# Patient Record
Sex: Male | Born: 1975 | Race: Black or African American | Hispanic: No | Marital: Married | Smoking: Never smoker
Health system: Southern US, Community
[De-identification: ages and names within clinical notes are randomized; demographics above are authoritative.]

## PROBLEM LIST (undated history)

## (undated) DIAGNOSIS — E785 Hyperlipidemia, unspecified: Secondary | ICD-10-CM

## (undated) DIAGNOSIS — E119 Type 2 diabetes mellitus without complications: Secondary | ICD-10-CM

## (undated) HISTORY — DX: Hyperlipidemia, unspecified: E78.5

## (undated) HISTORY — DX: Type 2 diabetes mellitus without complications: E11.9

---

## 2009-12-15 ENCOUNTER — Ambulatory Visit: Payer: Self-pay | Admitting: Internal Medicine

## 2009-12-15 ENCOUNTER — Encounter (INDEPENDENT_AMBULATORY_CARE_PROVIDER_SITE_OTHER): Payer: Self-pay | Admitting: Family Medicine

## 2009-12-15 LAB — CONVERTED CEMR LAB
ALT: 22 units/L (ref 0–53)
AST: 16 units/L (ref 0–37)
BUN: 20 mg/dL (ref 6–23)
Calcium: 9.5 mg/dL (ref 8.4–10.5)
HCT: 47.5 % (ref 39.0–52.0)
HDL: 35 mg/dL — ABNORMAL LOW (ref >39)
Hemoglobin: 16 g/dL (ref 13.0–17.0)
Lymphs Abs: 2 10*3/uL (ref 0.7–4.0)
Monocytes Absolute: 0.4 10*3/uL (ref 0.1–1.0)
Monocytes Relative: 8 % (ref 3–12)
Platelets: 146 10*3/uL — ABNORMAL LOW (ref 150–400)
Total Bilirubin: 1.1 mg/dL (ref 0.3–1.2)
Total CHOL/HDL Ratio: 6.6
Total Protein: 6.9 g/dL (ref 6.0–8.3)
Triglycerides: 131 mg/dL (ref <150)
WBC: 4.3 10*3/uL (ref 4.0–10.5)

## 2010-01-02 ENCOUNTER — Emergency Department (HOSPITAL_COMMUNITY): Admission: EM | Admit: 2010-01-02 | Discharge: 2010-01-02 | Payer: Self-pay | Admitting: Emergency Medicine

## 2010-01-13 ENCOUNTER — Ambulatory Visit: Payer: Self-pay | Admitting: Internal Medicine

## 2010-01-13 ENCOUNTER — Encounter (INDEPENDENT_AMBULATORY_CARE_PROVIDER_SITE_OTHER): Payer: Self-pay | Admitting: Family Medicine

## 2010-01-18 ENCOUNTER — Ambulatory Visit: Payer: Self-pay | Admitting: Internal Medicine

## 2010-01-20 ENCOUNTER — Ambulatory Visit (HOSPITAL_COMMUNITY): Admission: RE | Admit: 2010-01-20 | Discharge: 2010-01-20 | Payer: Self-pay | Admitting: Family Medicine

## 2010-02-08 ENCOUNTER — Ambulatory Visit: Payer: Self-pay | Admitting: Internal Medicine

## 2010-02-08 ENCOUNTER — Encounter (INDEPENDENT_AMBULATORY_CARE_PROVIDER_SITE_OTHER): Payer: Self-pay | Admitting: Family Medicine

## 2010-02-08 LAB — CONVERTED CEMR LAB: ALT: 14 units/L (ref 0–53)

## 2010-02-17 ENCOUNTER — Ambulatory Visit: Payer: Self-pay | Admitting: Internal Medicine

## 2010-04-27 ENCOUNTER — Encounter (INDEPENDENT_AMBULATORY_CARE_PROVIDER_SITE_OTHER): Payer: Self-pay | Admitting: Family Medicine

## 2010-04-27 ENCOUNTER — Ambulatory Visit: Payer: Self-pay | Admitting: Internal Medicine

## 2010-04-27 LAB — CONVERTED CEMR LAB
ALT: 19 units/L (ref 0–53)
Cholesterol: 123 mg/dL (ref 0–200)
HDL: 35 mg/dL — ABNORMAL LOW (ref >39)
Hgb A1c MFr Bld: 7.4 % — ABNORMAL HIGH (ref <5.7)
LDL Cholesterol: 67 mg/dL (ref 0–99)
Total CHOL/HDL Ratio: 3.5

## 2010-05-17 ENCOUNTER — Ambulatory Visit: Payer: Self-pay | Admitting: Internal Medicine

## 2010-05-17 ENCOUNTER — Encounter (INDEPENDENT_AMBULATORY_CARE_PROVIDER_SITE_OTHER): Payer: Self-pay | Admitting: Family Medicine

## 2010-05-17 LAB — CONVERTED CEMR LAB: LDH: 141 units/L (ref 94–250)

## 2010-10-17 ENCOUNTER — Encounter: Payer: Self-pay | Admitting: Family Medicine

## 2013-01-14 ENCOUNTER — Ambulatory Visit (INDEPENDENT_AMBULATORY_CARE_PROVIDER_SITE_OTHER): Payer: BC Managed Care – PPO | Admitting: Emergency Medicine

## 2013-01-14 ENCOUNTER — Encounter: Payer: Self-pay | Admitting: *Deleted

## 2013-01-14 VITALS — BP 116/80 | HR 73 | Temp 98.7°F | Resp 12 | Ht 66.0 in | Wt 183.0 lb

## 2013-01-14 DIAGNOSIS — E119 Type 2 diabetes mellitus without complications: Secondary | ICD-10-CM

## 2013-01-14 MED ORDER — METFORMIN HCL ER 500 MG PO TB24: ORAL_TABLET | ORAL | Status: DC

## 2013-01-14 MED ORDER — LISINOPRIL 5 MG PO TABS: 5.0000 mg | ORAL_TABLET | Freq: Every day | ORAL | Status: DC

## 2013-01-14 NOTE — Patient Instructions (Addendum)

## 2013-01-14 NOTE — Progress Notes (Addendum)
[facility] [address] [place] [address] [phone]  Date:  01/14/2013   Name:  Oscar Fernandez   DOB:  08-28-1976   MRN:  [id number]  PCP:  No PCP Per Patient    Chief Complaint: Medication Refill   History of Present Illness:  Oscar Fernandez is a 37 y.o. very pleasant male patient who presents with the following:  [facility] a [facility]  Has been without his metformin for 3 months or more and requests refills.  Denies symptoms other than polyuria and polydipsia.  No fever chills, weight loss, nausea or vomiting no stool change, cough or coryza.  Denies other complaint or health concern today.    There is no problem list on file for this patient.   No past medical history on file.  No past surgical history on file.  History  Substance Use Topics  . Smoking status: Never Smoker   . Smokeless tobacco: Not on file  . Alcohol Use: Not on file    No family history on file.  No Known Allergies  Medication list has been reviewed and updated.  No current outpatient prescriptions on file prior to visit.   No current facility-administered medications on file prior to visit.    Review of Systems:  As per HPI, otherwise negative.    Physical Examination: Filed Vitals:   01/14/13 1717  BP: 116/80  Pulse: 73  Temp: 98.7 F (37.1 C)  Resp: 12   Filed Vitals:   01/14/13 1717  Height: 5\' 6"  (1.676 m)  Weight: 183 lb (83.008 kg)   Body mass index is 29.55 kg/(m^2). Ideal Body Weight: Weight in (lb) to have BMI = 25: 154.6  GEN: WDWN, NAD, Non-toxic, A & O x 3 HEENT: Atraumatic, Normocephalic. Neck supple. No masses, No LAD. Ears and Nose: No external deformity. CV: RRR, No M/G/R. No JVD. No thrill. No extra heart sounds. PULM: CTA B, no wheezes, crackles, rhonchi. No retractions. No resp. distress. No accessory muscle use. ABD: S, NT, ND, +BS. No rebound. No HSM. EXTR: No c/c/e NEURO Normal gait.  PSYCH: Normally  interactive. Conversant. Not depressed or anxious appearing.  Calm demeanor.    Assessment and Plan: NIDDM Non compliant Signed,  Phillips Odor, MD   Follow up in one month at 104  Results for orders placed in visit on 01/14/13  POCT GLYCOSYLATED HEMOGLOBIN (HGB A1C)      Result Value Range   Hemoglobin A1C 12.7

## 2013-02-15 ENCOUNTER — Ambulatory Visit (INDEPENDENT_AMBULATORY_CARE_PROVIDER_SITE_OTHER): Payer: BC Managed Care – PPO | Admitting: Family Medicine

## 2013-02-15 ENCOUNTER — Encounter: Payer: Self-pay | Admitting: Family Medicine

## 2013-02-15 VITALS — BP 120/85 | HR 74 | Temp 97.9°F | Resp 16 | Ht 65.5 in | Wt 180.0 lb

## 2013-02-15 DIAGNOSIS — Z0000 Encounter for general adult medical examination without abnormal findings: Secondary | ICD-10-CM

## 2013-02-15 DIAGNOSIS — IMO0001 Reserved for inherently not codable concepts without codable children: Secondary | ICD-10-CM

## 2013-02-15 DIAGNOSIS — J309 Allergic rhinitis, unspecified: Secondary | ICD-10-CM

## 2013-02-15 DIAGNOSIS — Z79899 Other long term (current) drug therapy: Secondary | ICD-10-CM

## 2013-02-15 LAB — POCT UA - MICROSCOPIC ONLY
Bacteria, U Microscopic: NEGATIVE
Crystals, Ur, HPF, POC: NEGATIVE

## 2013-02-15 LAB — CBC WITH DIFFERENTIAL/PLATELET
Basophils Absolute: 0 10*3/uL (ref 0.0–0.1)
Eosinophils Absolute: 0.1 10*3/uL (ref 0.0–0.7)
Eosinophils Relative: 4 % (ref 0–5)
Lymphs Abs: 1.5 10*3/uL (ref 0.7–4.0)
MCH: 28.6 pg (ref 26.0–34.0)
MCHC: 35.8 g/dL (ref 30.0–36.0)
Monocytes Relative: 10 % (ref 3–12)
Neutrophils Relative %: 40 % — ABNORMAL LOW (ref 43–77)
WBC: 3.3 10*3/uL — ABNORMAL LOW (ref 4.0–10.5)

## 2013-02-15 LAB — LIPID PANEL
Cholesterol: 193 mg/dL (ref 0–200)
LDL Cholesterol: 142 mg/dL — ABNORMAL HIGH (ref 0–99)
Total CHOL/HDL Ratio: 5.1 Ratio
VLDL: 13 mg/dL (ref 0–40)

## 2013-02-15 LAB — POCT URINALYSIS DIPSTICK
Ketones, UA: NEGATIVE
Leukocytes, UA: NEGATIVE
Protein, UA: NEGATIVE
Spec Grav, UA: 1.025
Urobilinogen, UA: 0.2

## 2013-02-15 LAB — COMPREHENSIVE METABOLIC PANEL
Albumin: 4.2 g/dL (ref 3.5–5.2)
CO2: 29 mEq/L (ref 19–32)
Chloride: 103 mEq/L (ref 96–112)
Glucose, Bld: 137 mg/dL — ABNORMAL HIGH (ref 70–99)
Sodium: 139 mEq/L (ref 135–145)
Total Bilirubin: 1.3 mg/dL — ABNORMAL HIGH (ref 0.3–1.2)
Total Protein: 6.9 g/dL (ref 6.0–8.3)

## 2013-02-15 LAB — GLUCOSE, POCT (MANUAL RESULT ENTRY): POC Glucose: 138 mg/dl — AB (ref 70–99)

## 2013-02-15 LAB — TSH: TSH: 0.85 u[IU]/mL (ref 0.350–4.500)

## 2013-02-15 MED ORDER — FLUTICASONE PROPIONATE 50 MCG/ACT NA SUSP: 2.0000 | Freq: Every day | NASAL | Status: DC

## 2013-02-15 MED ORDER — CETIRIZINE HCL 10 MG PO TABS: 10.0000 mg | ORAL_TABLET | Freq: Every day | ORAL | Status: DC

## 2013-02-15 MED ORDER — METFORMIN HCL 1000 MG PO TABS: 1000.0000 mg | ORAL_TABLET | Freq: Two times a day (BID) | ORAL | Status: DC

## 2013-02-15 NOTE — Progress Notes (Signed)
Subjective:    Patient ID: Oscar Fernandez, male    DOB: 19-Oct-1975, 37 y.o.   MRN: [id number] Chief Complaint  Patient presents with  . Annual Exam    HPI  [name] is a delightful 37 yo with a PMHx sig for DMII.  He was a prev pt at [facility] (seen by Dr. Carolyne Fiscal and Dr. Allena Katz) but had not had any medical care for >1 yr so had been off all medication for some time. Reports that prior he was on metformin 1000 bid in addition to some pill he used to take 15-30 min before eating (likely a sulfonylurea).  Had ran out of metformin for > 3mos so last hgba1c 1 mo prev was 12.7. Now he was weaned up from metformin 500 XR qhs to 500 XR qid w/o side effects.  Works in the evening so occ has some problems remember to take it - especially if he is off that day and so not working. Does have meter at home but no strips so not checking sugars. Does not know type of meter.  Used to play soccor but now not getting any exercise. In [place], used to drink a ton of high sugar drinks, no alcohol - but lots of sodas, Gatorades, energy drinks, etc. Thinks that is why he developed severe DM so young.  Now only drinking only diet sodas. Still doing a high carb diet though - knows he needs to change but difficult as his indigenous foods are high in  "fufu," corn, grain, potatoes.  When he was off of all DM med he was having constant HAs and polyuria, polydyspia, and a lot of nocturia - to the point where he was having enuresis. However, all of this has stopped since starting back on the metformin.  No prob w/ feet. No prob w/ vision.  Has not had optho appt recently.  Fasting now - last meal at 10 pm, did have a diet soda at 3 am as worked last night.  Past Medical History  Diagnosis Date  . Diabetes mellitus without complication    Current Outpatient Prescriptions on File Prior to Visit  Medication Sig Dispense Refill  . lisinopril (PRINIVIL,ZESTRIL) 5 MG tablet Take 1 tablet (5 mg total) by mouth daily.  90 tablet  3    No current facility-administered medications on file prior to visit.   No Known Allergies History reviewed. No pertinent past surgical history. History   Social History  . Marital Status: Married    Spouse Name: N/A    Number of Children: N/A  . Years of Education: college   Occupational History  . industrial    Social History Main Topics  . Smoking status: Never Smoker   . Smokeless tobacco: None  . Alcohol Use: No  . Drug Use: No  . Sexually Active: Yes   Other Topics Concern  . None   Social History Narrative  . None   Sleeps from 5-10p.m. 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 [place]. Moved here with his wife and 1 yr old daughter from [place] - close to [place] - 5 yrs ago.  Now he has 3 daughters -ranging from 2 to 74 yo.  History reviewed. No pertinent family history.  Father passed away during his  first yr of University of unknown etiology but was sick for yrs prior - no known cancer or heart disease.  Mother w/o medical problems No DM in family  Review of Systems  Constitutional: Negative.   HENT: Positive for congestion. Negative for sore throat and sinus pressure.        Ears itching  Eyes: Positive for itching.  Respiratory: Negative.   Cardiovascular: Negative.   Gastrointestinal: Negative.   Endocrine: Negative.   Genitourinary: Negative.   Musculoskeletal: Negative.   Skin: Negative.   Allergic/Immunologic: Negative.   Neurological: Negative.   Hematological: Negative.   Psychiatric/Behavioral: Negative.       BP 120/85  Pulse 74  Temp(Src) 97.9 F (36.6 C) (Oral)  Resp 16  Ht 5' 5.5" (1.664 m)  Wt 180 lb (81.647 kg)  BMI 29.49 kg/m2 Objective:   Physical Exam  Constitutional: He is oriented to person, place, and time. He appears well-developed and well-nourished. No  distress.  HENT:  Head: Normocephalic and atraumatic.  Right Ear: External ear and ear canal normal. Tympanic membrane is injected and retracted. A middle ear effusion is present.  Left Ear: External ear and ear canal normal. Tympanic membrane is injected and retracted. A middle ear effusion is present.  Nose: Mucosal edema present. No rhinorrhea. Right sinus exhibits no maxillary sinus tenderness and no frontal sinus tenderness. Left sinus exhibits no maxillary sinus tenderness and no frontal sinus tenderness.  Mouth/Throat: Uvula is midline and mucous membranes are normal. Posterior oropharyngeal erythema present. No oropharyngeal exudate or posterior oropharyngeal edema.  Eyes: Conjunctivae are normal. Right eye exhibits no discharge. Left eye exhibits no discharge. No scleral icterus.  Neck: Normal range of motion. Neck supple. No thyromegaly present.  Cardiovascular: Normal rate, regular rhythm, normal heart sounds and intact distal pulses.   Pulmonary/Chest: Effort normal and breath sounds normal. No respiratory distress.  Abdominal: Soft. Bowel sounds are normal. He exhibits no distension and no mass. There is no tenderness. There is no rebound and no guarding.  Musculoskeletal: He exhibits no edema.  Lymphadenopathy:       Head (right side): No submandibular and no tonsillar adenopathy present.       Head (left side): No submandibular and no tonsillar adenopathy present.    He has no cervical adenopathy.       Right cervical: No superficial cervical and no posterior cervical adenopathy present.      Left cervical: No superficial cervical and no posterior cervical adenopathy present.       Right: No supraclavicular adenopathy present.       Left: No supraclavicular adenopathy present.  Neurological: He is alert and oriented to person, place, and time. He has normal reflexes. No cranial nerve deficit. He exhibits normal muscle tone.  nml monofil  Skin: Skin is warm and dry. No rash  noted. He is not diaphoretic. No erythema.  Bilateral legs with copious scars - [place]  Psychiatric: He has a normal mood and affect. His behavior is normal.      Results for orders placed in visit on 01/14/13  POCT GLYCOSYLATED HEMOGLOBIN (HGB A1C)      Result Value Range   Hemoglobin A1C 12.7      Assessment & Plan:  Routine general medical examination at a health care facility - Plan: CBC with Differential, Comprehensive metabolic panel, TSH, POCT UA - Microscopic Only, POCT urinalysis dipstick  Type II or unspecified type diabetes mellitus without mention of  complication, uncontrolled - Plan: Lipid panel, POCT glucose (manual entry), Microalbumin, urine, Ambulatory referral to Ophthalmology - Refer to optho. monofil today nml.  Recheck a1c at f/u in 1 mo.  D/c xr metformin 500 qid and change to 1000 immed release bid. If a1c not at goal at f/u, start additional med. rx given for strips, meter, and lancets. Need to discuss starting asa 81 at f/u.  Allergic rhinitis - start zyrtec and flonase x 1 mo  Encounter for long-term (current) use of other medications - cmp

## 2013-02-15 NOTE — Patient Instructions (Signed)
Diabetes and Exercise Regular exercise is important and can help:   Control blood glucose (sugar).  Decrease blood pressure.    Control blood lipids (cholesterol, triglycerides).  Improve overall health. BENEFITS FROM EXERCISE  Improved fitness.  Improved flexibility.  Improved endurance.  Increased bone density.  Weight control.  Increased muscle strength.  Decreased body fat.  Improvement of the body's use of insulin, a hormone.  Increased insulin sensitivity.  Reduction of insulin needs.  Reduced stress and tension.  Helps you feel better. People with diabetes who add exercise to their lifestyle gain additional benefits, including:  Weight loss.  Reduced appetite.  Improvement of the body's use of blood glucose.  Decreased risk factors for heart disease:  Lowering of cholesterol and triglycerides.  Raising the level of good cholesterol (high-density lipoproteins, HDL).  Lowering blood sugar.  Decreased blood pressure. TYPE 1 DIABETES AND EXERCISE  Exercise will usually lower your blood glucose.  If blood glucose is greater than 240 mg/dl, check urine ketones. If ketones are present, do not exercise.  Location of the insulin injection sites may need to be adjusted with exercise. Avoid injecting insulin into areas of the body that will be exercised. For example, avoid injecting insulin into:  The arms when playing tennis.  The legs when jogging. For more information, discuss this with your caregiver.  Keep a record of:  Food intake.  Type and amount of exercise.  Expected peak times of insulin action.  Blood glucose levels. Do this before, during, and after exercise. Review your records with your caregiver. This will help you to develop guidelines for adjusting food intake and insulin amounts.  TYPE 2 DIABETES AND EXERCISE  Regular physical activity can help control blood glucose.  Exercise is important because it may:  Increase the  body's sensitivity to insulin.  Improve blood glucose control.  Exercise reduces the risk of heart disease. It decreases serum cholesterol and triglycerides. It also lowers blood pressure.  Those who take insulin or oral hypoglycemic agents should watch for signs of hypoglycemia. These signs include dizziness, shaking, sweating, chills, and confusion.  Body water is lost during exercise. It must be replaced. This will help to avoid loss of body fluids (dehydration) or heat stroke. Be sure to talk to your caregiver before starting an exercise program to make sure it is safe for you. Remember, any activity is better than none.  Document Released: 12/03/2003 Document Revised: 12/05/2011 Document Reviewed: 03/19/2009 [facility] Patient Information 2014 [place], [place]. Diabetes and Standards of Medical Care  Diabetes is complicated. You may find that your diabetes team includes a dietitian, nurse, diabetes educator, eye doctor, and more. To help everyone know what is going on and to help you get the care you deserve, the following schedule of care was developed to help keep you on track. Below are the tests, exams, vaccines, medicines, education, and plans you will need. A1c test  Performed at least 2 times a year if you are meeting treatment goals.  Performed 4 times a year if therapy has changed or if you are not meeting treatment goals. Blood pressure test  Performed at every routine medical visit. The goal is less than 120/80 mmHg. Dental exam  Follow up with the dentist regularly. Eye exam  Diagnosed with type 1 diabetes as a child: Get an exam upon reaching the age of 10 years or older and having had diabetes for 3 5 years. Yearly eye exams are recommended after that initial eye exam.  Diagnosed  with type 1 diabetes as an adult: Get an exam within 5 years of diagnosis and then yearly.  Diagnosed with type 2 diabetes: Get an exam as soon as possible after the diagnosis and then  yearly. Foot care exam  Visual foot exams are performed at every routine medical visit. The exams check for cuts, injuries, or other problems with the feet.  A comprehensive foot exam should be done yearly. This includes visual inspection as well as assessing foot pulses and testing for loss of sensation. Kidney function test (urine microalbumin)  Performed once a year.  Type 1 diabetes: The first test is performed 5 years after diagnosis.  Type 2 diabetes: The first test is performed at the time of diagnosis.  A serum creatinine and estimated glomerular filtration rate (eGFR) test is done once a year to tell the level of chronic kidney disease (CKD), if present. Lipid profile (Cholesterol, HDL, LDL, Triglycerides)  Performed every 5 years for most people.  The goal for LDL is less than 100 mg/dl. If at high risk, the goal is less than 70 mg/dl.  The goal for HDL is 40 mg/dl 50 mg/dl for men and 50 mg/dl 60 mg/dl for women. An HDL cholesterol of 60 mg/dL or higher gives some protection against heart disease.  The goal for triglycerides is less than 150 mg/dl. Influenza vaccine, pneumococcal vaccine, and hepatitis B vaccine  The influenza vaccine is recommended yearly.  The pneumococcal vaccine is generally given once in a lifetime. However, there are some instances when another vaccination is recommended. Check with your caregiver.  The hepatitis B vaccine is also recommended for adults with diabetes. Diabetes self-management education  Recommended at diagnosis and ongoing as needed. Treatment plan  Reviewed at every medical visit. Document Released: 07/10/2009 Document Revised: 08/29/2012 Document Reviewed: 03/15/2011 [facility] Patient Information 2014 [place], [place]. Diabetes Meal Planning Guide The diabetes meal planning guide is a tool to help you plan your meals and snacks. It is important for people with diabetes to manage their blood glucose (sugar) levels. Choosing the  right foods and the right amounts throughout your day will help control your blood glucose. Eating right can even help you improve your blood pressure and reach or maintain a healthy weight. CARBOHYDRATE COUNTING MADE EASY When you eat carbohydrates, they turn to sugar. This raises your blood glucose level. Counting carbohydrates can help you control this level so you feel better. When you plan your meals by counting carbohydrates, you can have more flexibility in what you eat and balance your medicine with your food intake. Carbohydrate counting simply means adding up the total amount of carbohydrate grams in your meals and snacks. Try to eat about the same amount at each meal. Foods with carbohydrates are listed below. Each portion below is 1 carbohydrate serving or 15 grams of carbohydrates. Ask your dietician how many grams of carbohydrates you should eat at each meal or snack. Grains and Starches  1 slice bread.   English muffin or hotdog/hamburger bun.   cup cold cereal (unsweetened).   cup cooked pasta or rice.   cup starchy vegetables (corn, potatoes, peas, beans, winter squash).  1 tortilla (6 inches).   bagel.  1 waffle or pancake (size of a CD).   cup cooked cereal.  4 to 6 small crackers. *Whole grain is recommended. Fruit  1 cup fresh unsweetened berries, melon, papaya, pineapple.  1 small fresh fruit.   banana or mango.   cup fruit juice (4 oz unsweetened).  cup canned fruit in natural juice or water.  2 tbs dried fruit.  12 to 15 grapes or cherries. Milk and Yogurt  1 cup fat-free or 1% milk.  1 cup soy milk.  6 oz light yogurt with sugar-free sweetener.  6 oz low-fat soy yogurt.  6 oz plain yogurt. Vegetables  1 cup raw or  cup cooked is counted as 0 carbohydrates or a "free" food.  If you eat 3 or more servings at 1 meal, count them as 1 carbohydrate serving. Other Carbohydrates   oz chips or pretzels.   cup ice cream or frozen  yogurt.   cup sherbet or sorbet.  2 inch square cake, no frosting.  1 tbs honey, sugar, jam, jelly, or syrup.  2 small cookies.  3 squares of graham crackers.  3 cups popcorn.  6 crackers.  1 cup broth-based soup.  Count 1 cup casserole or other mixed foods as 2 carbohydrate servings.  Foods with less than 20 calories in a serving may be counted as 0 carbohydrates or a "free" food. You may want to purchase a book or computer software that lists the carbohydrate gram counts of different foods. In addition, the nutrition facts panel on the labels of the foods you eat are a good source of this information. The label will tell you how big the serving size is and the total number of carbohydrate grams you will be eating per serving. Divide this number by 15 to obtain the number of carbohydrate servings in a portion. Remember, 1 carbohydrate serving equals 15 grams of carbohydrate. SERVING SIZES Measuring foods and serving sizes helps you make sure you are getting the right amount of food. The list below tells how big or small some common serving sizes are.  1 oz.........4 stacked dice.  3 oz........Mar[place] KitchenDeck of cards.  1 tsp.......[place] KitchenTip of little finger.  1 tbs......Marland KitchenMarland KitchenThumb.  2 tbs.......Marland KitchenGolf ball.   cup......Marland KitchenHalf of a fist.  1 cup.......Marland KitchenA fist. SAMPLE DIABETES MEAL PLAN Below is a sample meal plan that includes foods from the grain and starches, dairy, vegetable, fruit, and meat groups. A dietician can individualize a meal plan to fit your calorie needs and tell you the number of servings needed from each food group. However, controlling the total amount of carbohydrates in your meal or snack is more important than making sure you include all of the food groups at every meal. You may interchange carbohydrate containing foods (dairy, starches, and fruits). The meal plan below is an example of a 2000 calorie diet using carbohydrate counting. This meal plan has 17 carbohydrate  servings. Breakfast  1 cup oatmeal (2 carb servings).   cup light yogurt (1 carb serving).  1 cup blueberries (1 carb serving).   cup almonds. Snack  1 large apple (2 carb servings).  1 low-fat string cheese stick. Lunch  Chicken breast salad.  1 cup spinach.   cup chopped tomatoes.  2 oz chicken breast, sliced.  2 tbs low-fat [place] dressing.  12 whole-wheat crackers (2 carb servings).  12 to 15 grapes (1 carb serving).  1 cup low-fat milk (1 carb serving). Snack  1 cup carrots.   cup hummus (1 carb serving). Dinner  3 oz broiled salmon.  1 cup brown rice (3 carb servings). Snack  1  cups steamed broccoli (1 carb serving) drizzled with 1 tsp olive oil and lemon juice.  1 cup light pudding (2 carb servings). DIABETES MEAL PLANNING WORKSHEET Your dietician can use this worksheet to help  you decide how many servings of foods and what types of foods are right for you.  BREAKFAST Food Group and Servings / Carb Servings Grain/Starches __________________________________ Dairy __________________________________________ Vegetable ______________________________________ Fruit ___________________________________________ Meat __________________________________________ Fat ____________________________________________ LUNCH Food Group and Servings / Carb Servings Grain/Starches ___________________________________ Dairy ___________________________________________ Fruit ____________________________________________ Meat ___________________________________________ Fat _____________________________________________ Laural Golden Food Group and Servings / Carb Servings Grain/Starches ___________________________________ Dairy ___________________________________________ Fruit ____________________________________________ Meat ___________________________________________ Fat _____________________________________________ SNACKS Food Group and Servings / Carb  Servings Grain/Starches ___________________________________ Dairy ___________________________________________ Vegetable _______________________________________ Fruit ____________________________________________ Meat ___________________________________________ Fat _____________________________________________ DAILY TOTALS Starches _________________________ Vegetable ________________________ Fruit ____________________________ Dairy ____________________________ Meat ____________________________ Fat ______________________________ Document Released: 06/09/2005 Document Revised: 12/05/2011 Document Reviewed: 04/20/2009 [employer] Patient Information 2014 [employer]. Diabetes, Eating Away From Home Sometimes, you might eat in a restaurant or have meals that are prepared by someone else. You can enjoy eating out. However, the portions in restaurants may be much larger than needed. Listed below are some ideas to help you choose foods that will keep your blood glucose (sugar) in better control.  TIPS FOR EATING OUT  Know your meal plan and how many carbohydrate servings you should have at each meal. You may wish to carry a copy of your meal plan in your purse or wallet. Learn the foods included in each food group.  Make a list of restaurants near you that offer healthy choices. Take a copy of the carry-out menus to see what they offer. Then, you can plan what you will order ahead of time.  Become familiar with serving sizes by practicing them at home using measuring cups and spoons. Once you learn to recognize portion sizes, you will be able to correctly estimate the amount of total carbohydrate you are allowed to eat at the restaurant. Ask for a takeout box if the portion is more than you should have. When your food comes, leave the amount you should have on the plate, and put the rest in the takeout box before you start eating.  Plan ahead if your mealtime will be different from usual. Check  with your caregiver to find out how to time meals and medicine if you are taking insulin.  Avoid high-fat foods, such as fried foods, cream sauces, high-fat salad dressings, or any added butter or margarine.  Do not be afraid to ask questions. Ask your server about the portion size, cooking methods, ingredients and if items can be substituted. Restaurants do not list all available items on the menu. You can ask for your main entree to be prepared using skim milk, oil instead of butter or margarine, and without gravy or sauces. Ask your waiter or waitress to serve salad dressings, gravy, sauces, margarine, and sour cream on the side. You can then add the amount your meal plan suggests.  Add more vegetables whenever possible.  Avoid items that are labeled "jumbo," "giant," "deluxe," or "supersized."  You may want to split an entre with someone and order an extra side salad.  Watch for hidden calories in foods like croutons, bacon, or cheese.  Ask your server to take away the bread basket or chips from your table.  Order a dinner salad as an appetizer. You can eat most foods served in a restaurant. Some foods are better choices than others. Breads and Starches  Recommended: All kinds of bread (wheat, rye, white, oatmeal, [place], [place], raisin), hard or soft dinner rolls, frankfurter or hamburger buns, small bagels, small corn or whole-wheat flour tortillas.  Avoid: Frosted or glazed breads,  butter rolls, egg or cheese breads, croissants, sweet rolls, pastries, coffee cake, glazed or frosted doughnuts, muffins. Crackers  Recommended: Animal crackers, graham, rye, saltine, oyster, and matzoth crackers. Bread sticks, melba toast, rusks, pretzels, popcorn (without fat), zwieback toast.  Avoid: High-fat snack crackers or chips. Buttered popcorn. Cereals  Recommended: Hot and cold cereals. Whole grains such as oatmeal or shredded wheat are good choices.  Avoid: Sugar-coated or granola type  cereals. Potatoes/Pasta/Rice/Beans  Recommended: Order baked, boiled, or mashed potatoes, rice or noodles without added fat, whole beans. Order gravies, butter, margarine, or sauces on the side so you can control the amount you add.  Avoid: Hash browns or fried potatoes. Potatoes, pasta, or rice prepared with cream or cheese sauce. Potato or pasta salads prepared with large amounts of dressing. Fried beans or fried rice. Vegetables  Recommended: Order steamed, baked, boiled, or stewed vegetables without sauces or extra fat. Ask that sauce be served on the side. If vegetables are not listed on the menu, ask what is available.  Avoid: Vegetables prepared with cream, butter, or cheese sauce. Fried vegetables. Salad Bars  Recommended: Many of the vegetables at a salad bar are considered "free." Use lemon juice, vinegar, or low-calorie salad dressing (fewer than 20 calories per serving) as "free" dressings for your salad. Look for salad bar ingredients that have no added fat or sugar such as tomatoes, lettuce, cucumbers, broccoli, carrots, onions, and mushrooms.  Avoid: Prepared salads with large amounts of dressing, such as coleslaw, caesar salad, macaroni salad, bean salad, or carrot salad. Fruit  Recommended: Eat fresh fruit or fresh fruit salad without added dressing. A salad bar often offers fresh fruit choices, but canned fruit at a restaurant is usually packed in sugar or syrup.  Avoid: Sweetened canned or frozen fruits, plain or sweetened fruit juice. Fruit salads with dressing, sour cream, or sugar added to them. Meat and Meat Substitutes  Recommended: Order broiled, baked, roasted, or grilled meat, poultry, or fish. Trim off all visible fat. Do not eat the skin of poultry. The size stated on the menu is the raw weight. Meat shrinks by  in cooking (for example, 4 oz raw equals 3 oz cooked meat).  Avoid: Deep-fat fried meat, poultry, or fish. Breaded meats. Eggs  Recommended: Order  soft, hard-cooked, poached, or scrambled eggs. Omelets may be okay, depending on what ingredients are added. Egg substitutes are also a good choice.  Avoid: Fried eggs, eggs prepared with cream or cheese sauce. Milk  Recommended: Order low-fat or fat-free milk according to your meal plan. Plain, nonfat yogurt or flavored yogurt with no sugar added may be used as a substitute for milk. Soy milk may also be used.  Avoid: Milk shakes or sweetened milk beverages. Soups and Combination Foods  Recommended: Clear broth or consomm are "free" foods and may be used as an appetizer. Broth-based soups with fat removed count as a starch serving and are preferred over cream soups. Soups made with beans or split peas may be eaten but count as a starch.  Avoid: Fatty soups, soup made with cream, cheese soup. Combination foods prepared with excessive amounts of fat or with cream or cheese sauces. Desserts and Sweets  Recommended: Ask for fresh fruit. Sponge or angel food cake without icing, ice milk, no sugar added ice cream, sherbet, or frozen yogurt may fit into your meal plan occasionally.  Avoid: Pastries, puddings, pies, cakes with icing, custard, gelatin desserts. Fats and Oils  Recommended: Choose healthy fats such  as olive oil, canola oil, or tub margarine, reduced fat or fat-free sour cream, cream cheese, avocado, or nuts.  Avoid: Any fats in excess of your allowed portion. Deep-fried foods or any food with a large amount of fat. Note: Ask for all fats to be served on the side, and limit your portion sizes according to your meal plan. Document Released: 09/12/2005 Document Revised: 12/05/2011 Document Reviewed: 04/02/2009 [facility] Patient Information 2014 [place], [place].

## 2013-02-20 ENCOUNTER — Other Ambulatory Visit: Payer: Self-pay | Admitting: Family Medicine

## 2013-02-20 ENCOUNTER — Encounter: Payer: Self-pay | Admitting: *Deleted

## 2013-02-20 DIAGNOSIS — E785 Hyperlipidemia, unspecified: Secondary | ICD-10-CM

## 2013-02-20 MED ORDER — PRAVASTATIN SODIUM 40 MG PO TABS: 40.0000 mg | ORAL_TABLET | Freq: Every day | ORAL | Status: DC

## 2013-02-27 ENCOUNTER — Ambulatory Visit: Payer: BC Managed Care – PPO

## 2013-02-27 ENCOUNTER — Ambulatory Visit (INDEPENDENT_AMBULATORY_CARE_PROVIDER_SITE_OTHER): Payer: BC Managed Care – PPO | Admitting: Family Medicine

## 2013-02-27 VITALS — BP 114/70 | HR 67 | Temp 97.7°F | Resp 18 | Ht 65.5 in | Wt 179.0 lb

## 2013-02-27 DIAGNOSIS — K5900 Constipation, unspecified: Secondary | ICD-10-CM

## 2013-02-27 DIAGNOSIS — IMO0001 Reserved for inherently not codable concepts without codable children: Secondary | ICD-10-CM

## 2013-02-27 DIAGNOSIS — R109 Unspecified abdominal pain: Secondary | ICD-10-CM

## 2013-02-27 LAB — POCT CBC
Lymph, poc: 2.2 (ref 0.6–3.4)
MCH, POC: 28.7 pg (ref 27–31.2)
MCHC: 32.2 g/dL (ref 31.8–35.4)
MPV: 9.4 fL (ref 0–99.8)
Platelet Count, POC: 233 10*3/uL (ref 142–424)
RBC: 5.54 M/uL (ref 4.69–6.13)

## 2013-02-27 LAB — COMPREHENSIVE METABOLIC PANEL
Albumin: 4.5 g/dL (ref 3.5–5.2)
Alkaline Phosphatase: 50 U/L (ref 39–117)
BUN: 10 mg/dL (ref 6–23)
CO2: 29 mEq/L (ref 19–32)
Calcium: 9.5 mg/dL (ref 8.4–10.5)
Chloride: 101 mEq/L (ref 96–112)
Glucose, Bld: 167 mg/dL — ABNORMAL HIGH (ref 70–99)
Total Bilirubin: 1 mg/dL (ref 0.3–1.2)
Total Protein: 6.8 g/dL (ref 6.0–8.3)

## 2013-02-27 MED ORDER — POLYETHYLENE GLYCOL 3350 17 GM/SCOOP PO POWD: 17.0000 g | Freq: Two times a day (BID) | ORAL | Status: DC

## 2013-02-27 MED ORDER — DOCUSATE SODIUM 100 MG PO CAPS: 100.0000 mg | ORAL_CAPSULE | Freq: Two times a day (BID) | ORAL | Status: DC

## 2013-02-27 NOTE — Progress Notes (Signed)
Subjective:    Patient ID: Oscar Fernandez, male    DOB: [birth year], 37 y.o.   MRN: [id number] Chief Complaint  Patient presents with  . Abdominal Pain    Since last Friday- no vomiting  . Diarrhea    HPI   Stomach hurting x 5d - since last Friday.  Is is intermittent feels like it is cramping and improves with laying on stomach.  No n/v.  Normal appetite.  Sometimes food makes it worse but not certain - maybe greasy foods make it worse.  Diarrhea just started this morning - 2 episodes of small volume.  Before that, was feeling very constipated with little small hard stools. Feeling weak.  No prob tol metformin.  Checking sugars - 198.    Past Medical History  Diagnosis Date  . Diabetes mellitus without complication   . Hyperlipidemia    Current Outpatient Prescriptions on File Prior to Visit  Medication Sig Dispense Refill  . cetirizine (ZYRTEC) 10 MG tablet Take 1 tablet (10 mg total) by mouth at bedtime.  30 tablet  11  . fluticasone (FLONASE) 50 MCG/ACT nasal spray Place 2 sprays into the nose at bedtime.  16 g  6  . lisinopril (PRINIVIL,ZESTRIL) 5 MG tablet Take 1 tablet (5 mg total) by mouth daily.  90 tablet  3  . metFORMIN (GLUCOPHAGE) 1000 MG tablet Take 1 tablet (1,000 mg total) by mouth 2 (two) times daily with a meal.  180 tablet  3  . pravastatin (PRAVACHOL) 40 MG tablet Take 1 tablet (40 mg total) by mouth daily.  90 tablet  1   No current facility-administered medications on file prior to visit.   No Known Allergies   Review of Systems  Constitutional: Negative for fever, chills, diaphoresis, activity change and appetite change.  Respiratory: Negative for shortness of breath.   Cardiovascular: Negative for chest pain.  Gastrointestinal: Positive for abdominal pain, diarrhea, constipation and abdominal distention. Negative for nausea, vomiting, blood in stool, anal bleeding and rectal pain.  Genitourinary: Negative for dysuria, frequency and decreased urine volume.   Neurological: Positive for weakness.  Hematological: Negative for adenopathy.      BP 114/70  Pulse 67  Temp(Src) 97.7 F (36.5 C) (Oral)  Resp 18  Ht 5' 5.5" (1.664 m)  Wt 179 lb (81.194 kg)  BMI 29.32 kg/m2  SpO2 100% Objective:   Physical Exam  Constitutional: He appears well-developed and well-nourished. No distress.  HENT:  Head: Normocephalic and atraumatic.  Neck: Normal range of motion. Neck supple. No thyromegaly present.  Cardiovascular: Normal rate, regular rhythm and normal heart sounds.   Pulmonary/Chest: Effort normal and breath sounds normal.  Abdominal: Soft. Normal appearance and bowel sounds are normal. He exhibits no distension and no mass. There is no hepatosplenomegaly. There is tenderness in the left lower quadrant. There is no rigidity, no rebound, no guarding, no CVA tenderness, no tenderness at McBurney's point and negative Murphy's sign. No hernia.  Genitourinary: Rectum normal and prostate normal. Rectal exam shows no tenderness and anal tone normal. Guaiac negative stool.  Lymphadenopathy:    He has no cervical adenopathy.  Skin: He is not diaphoretic.          Results for orders placed in visit on 02/27/13  POCT CBC      Result Value Range   WBC 4.7  4.6 - 10.2 K/uL   Lymph, poc 2.2  0.6 - 3.4   POC LYMPH PERCENT 47.3  10 - 50 %L  MID (cbc) 0.4  0 - 0.9   POC MID % 8.6  0 - 12 %M   POC Granulocyte 2.1  2 - 6.9   Granulocyte percent 44.1  37 - 80 %G   RBC 5.54  4.69 - 6.13 M/uL   Hemoglobin 15.9  14.1 - 18.1 g/dL   HCT, POC 91.4  78.2 - 53.7 %   MCV 89.2  80 - 97 fL   MCH, POC 28.7  27 - 31.2 pg   MCHC 32.2  31.8 - 35.4 g/dL   RDW, POC 95.6     Platelet Count, POC 233  142 - 424 K/uL   MPV 9.4  0 - 99.8 fL  GLUCOSE, POCT (MANUAL RESULT ENTRY)      Result Value Range   POC Glucose 175 (*) 70 - 99 mg/dl    [facility] reading (PRIMARY) by  Dr. Clelia Croft. Non-specific bowel gas pattern, moderate amount of stool  Assessment & Plan:  Type II or  unspecified type diabetes mellitus without mention of complication, uncontrolled - Plan: POCT CBC, POCT glucose (manual entry), Comprehensive metabolic panel - just started on high dose metformin 6 wks ago so recheck in another 6 wks for a1c but will likely need ot start on an additional med as cbgs still elev.  Abdominal pain, unspecified site - Plan: POCT CBC, POCT glucose (manual entry), Comprehensive metabolic panel, DG Abd 2 Views - suspect due to constipation - start w/ miralax and colace. Try fleets enema. RTC if continues.  Unspecified constipation  Meds ordered this encounter  Medications  . polyethylene glycol powder (GLYCOLAX/MIRALAX) powder    Sig: Take 17 g by mouth 2 (two) times daily.    Dispense:  527 g    Refill:  1  . docusate sodium (COLACE) 100 MG capsule    Sig: Take 1 capsule (100 mg total) by mouth 2 (two) times daily.    Dispense:  10 capsule    Refill:  0

## 2013-02-27 NOTE — Patient Instructions (Addendum)

## 2014-03-14 ENCOUNTER — Ambulatory Visit: Payer: BC Managed Care – PPO | Admitting: Family Medicine

## 2014-04-18 ENCOUNTER — Encounter: Payer: Self-pay | Admitting: Family Medicine

## 2014-04-18 ENCOUNTER — Ambulatory Visit (INDEPENDENT_AMBULATORY_CARE_PROVIDER_SITE_OTHER): Payer: 59

## 2014-04-18 ENCOUNTER — Ambulatory Visit (INDEPENDENT_AMBULATORY_CARE_PROVIDER_SITE_OTHER): Payer: 59 | Admitting: Family Medicine

## 2014-04-18 VITALS — BP 106/80 | HR 58 | Temp 98.1°F | Resp 16 | Ht 66.0 in | Wt 181.6 lb

## 2014-04-18 DIAGNOSIS — E785 Hyperlipidemia, unspecified: Secondary | ICD-10-CM

## 2014-04-18 DIAGNOSIS — IMO0001 Reserved for inherently not codable concepts without codable children: Secondary | ICD-10-CM

## 2014-04-18 DIAGNOSIS — E1165 Type 2 diabetes mellitus with hyperglycemia: Secondary | ICD-10-CM

## 2014-04-18 DIAGNOSIS — Z23 Encounter for immunization: Secondary | ICD-10-CM

## 2014-04-18 DIAGNOSIS — M79644 Pain in right finger(s): Secondary | ICD-10-CM

## 2014-04-18 DIAGNOSIS — M79609 Pain in unspecified limb: Secondary | ICD-10-CM

## 2014-04-18 DIAGNOSIS — M6530 Trigger finger, unspecified finger: Secondary | ICD-10-CM

## 2014-04-18 DIAGNOSIS — Z79899 Other long term (current) drug therapy: Secondary | ICD-10-CM

## 2014-04-18 LAB — CBC
HEMATOCRIT: 42.9 % (ref 39.0–52.0)
HEMOGLOBIN: 14.8 g/dL (ref 13.0–17.0)
MCH: 28.6 pg (ref 26.0–34.0)
MCHC: 34.5 g/dL (ref 30.0–36.0)
MCV: 82.8 fL (ref 78.0–100.0)
Platelets: 189 10*3/uL (ref 150–400)
RBC: 5.18 MIL/uL (ref 4.22–5.81)
RDW: 14.1 % (ref 11.5–15.5)
WBC: 3.9 10*3/uL — AB (ref 4.0–10.5)

## 2014-04-18 LAB — POCT GLYCOSYLATED HEMOGLOBIN (HGB A1C): HEMOGLOBIN A1C: 7.8

## 2014-04-18 MED ORDER — GLIPIZIDE ER 2.5 MG PO TB24: 2.5000 mg | ORAL_TABLET | Freq: Every day | ORAL | Status: DC

## 2014-04-18 MED ORDER — ASPIRIN EC 81 MG PO TBEC: 81.0000 mg | DELAYED_RELEASE_TABLET | Freq: Every day | ORAL | Status: AC

## 2014-04-18 MED ORDER — MELOXICAM 15 MG PO TABS: 15.0000 mg | ORAL_TABLET | Freq: Every day | ORAL | Status: DC

## 2014-04-18 MED ORDER — METFORMIN HCL 1000 MG PO TABS: 1000.0000 mg | ORAL_TABLET | Freq: Two times a day (BID) | ORAL | Status: DC

## 2014-04-18 NOTE — Progress Notes (Addendum)
Subjective:    Patient ID: Oscar Fernandez, male    DOB: 08/24/1976, 38 y.o.   MRN: [id number] This chart was scribed for Sherren Mocha, MD by Julian Hy, ED Scribe. The patient was seen in Room 24. The patient's care was started at 2:59 PM.   Chief Complaint  Patient presents with  . Medication Refill    METFORMIN, and RIGHT THUMB PAIN X 3-4 MONTHS   Past Medical History  Diagnosis Date  . Diabetes mellitus without complication   . Hyperlipidemia    Current Outpatient Prescriptions on File Prior to Visit  Medication Sig Dispense Refill  . metFORMIN (GLUCOPHAGE) 1000 MG tablet Take 1 tablet (1,000 mg total) by mouth 2 (two) times daily with a meal.  180 tablet  3  . cetirizine (ZYRTEC) 10 MG tablet Take 1 tablet (10 mg total) by mouth at bedtime.  30 tablet  11  . docusate sodium (COLACE) 100 MG capsule Take 1 capsule (100 mg total) by mouth 2 (two) times daily.  10 capsule  0  . fluticasone (FLONASE) 50 MCG/ACT nasal spray Place 2 sprays into the nose at bedtime.  16 g  6  . lisinopril (PRINIVIL,ZESTRIL) 5 MG tablet Take 1 tablet (5 mg total) by mouth daily.  90 tablet  3  . polyethylene glycol powder (GLYCOLAX/MIRALAX) powder Take 17 g by mouth 2 (two) times daily.  527 g  1  . pravastatin (PRAVACHOL) 40 MG tablet Take 1 tablet (40 mg total) by mouth daily.  90 tablet  1   No current facility-administered medications on file prior to visit.   No Known Allergies   HPI HPI Comments: Oscar Fernandez is a 38 y.o. male who presents to the [facility] for medication refill for Metformin. Pt reports he ran out of his Metformin about 10 days ago. Pt states he checks his blood sugar every other day in the mornings and he ranges from 127-170. Pt denies feeling like his sugar is ever too low. Pt denies visiting the opthalmologist. Pt states he intermittently takes a baby aspirin. Pt denies any chills, fever, constipation, diarrhea, nausea and vomiting.  Pt states he has  right thumb pain. Pt states he cannot use his right thumb. Pt states it feels like his thumb gets stuck and he is unable to lift it. Pt denies radiating pain.  Seen last one year previously, had been off of all of his medication at that time. His A1c increased to 12.7. Placed on Metformin 1000 BID. He was referred to opthalmology and given a prescription for a meter.    Review of Systems  Constitutional: Negative for fever and chills.  Gastrointestinal: Negative for nausea, vomiting, diarrhea and constipation.  Musculoskeletal: Positive for arthralgias (right thumb).  Neurological: Negative for weakness and numbness.      Triage Vitals: BP 106/80  Pulse 58  Temp(Src) 98.1 F (36.7 C) (Oral)  Resp 16  Ht 5\' 6"  (1.676 m)  Wt 181 lb 9.6 oz (82.373 kg)  BMI 29.32 kg/m2  SpO2 99% Objective:   Physical Exam  Nursing note and vitals reviewed. Constitutional: He is oriented to person, place, and time. He appears well-developed and well-nourished. No distress.  HENT:  Head: Normocephalic and atraumatic.  Eyes: Conjunctivae and EOM are normal.  Neck: Neck supple. No tracheal deviation present.  Cardiovascular: Normal rate, regular rhythm and normal heart sounds.   Pulses:      Dorsalis pedis pulses are 2+ on the right  side, and 2+ on the left side.       Posterior tibial pulses are 2+ on the right side, and 2+ on the left side.  Negative phalens and reverse phalens  Pulmonary/Chest: Effort normal and breath sounds normal. No respiratory distress.  Musculoskeletal:  He is having pain in the 1st MCP. No pain over the snuff box or extensor tendon. Significant pain over the radial aspect of the 1st MCP joint and palmar aspect of the 1st carpal. Significant pain with thumb opposition and flexion.   Neurological: He is alert and oriented to person, place, and time.  Normal monofilament exam. Negative Phalen's and reverse Phalen's. Negative Tinel's.   Skin: Skin is warm and dry.  Psychiatric:  He has a normal mood and affect. His behavior is normal.      Results for orders placed in visit on 04/18/14  POCT GLYCOSYLATED HEMOGLOBIN (HGB A1C)      Result Value Ref Range   Hemoglobin A1C 7.8     Right hand x-ray. Primary reading by Dr. Clelia CroftShaw. 04/18/2014. IMPRESSION: Normal.  CLINICAL DATA: Right thumb pain  EXAM: RIGHT HAND - COMPLETE 3+ VIEW  COMPARISON: None.  FINDINGS: There is no evidence of fracture or dislocation. There is no evidence of arthropathy or other focal bone abnormality. Soft tissues are unremarkable.  IMPRESSION: Negative.   Assessment & Plan:  3:05 PM- Patient informed of current plan for treatment and evaluation and agrees with plan at this time. Thumb pain, right - Plan: Sedimentation rate, DG Hand Complete Right  Type II or unspecified type diabetes mellitus without mention of complication, uncontrolled - Plan: POCT glycosylated hemoglobin (Hb A1C), COMPLETE METABOLIC PANEL WITH GFR, CBC, Microalbumin/Creatinine Ratio, Urine, Ambulatory referral to Ophthalmology, CANCELED: POCT SEDIMENTATION RATE - has been off of metformin x 2 wks so hgba1c of 7.8 is actually pretty good - however, reported cbgs all to high and pt was prev on metformin and sulfonylurea so start glucotrol xl 2.5 in addition, recheck in 3 mos - then if doing well, ok to go to 1 yr f/u as pt really wanting to do. Start asa 81 and need DM eye exam. nml monofil today. Was prev on lisinopril but bp actually quite low today - restart if microalb is elevated. Was normal so ok to hold off on acei/arb for now.  Encounter for long-term (current) use of other medications - Plan: COMPLETE METABOLIC PANEL WITH GFR, CBC, TSH, Microalbumin/Creatinine Ratio, Urine, Sedimentation rate  Other and unspecified hyperlipidemia - Plan: COMPLETE METABOLIC PANEL WITH GFR, CBC, TSH, Lipid panel - was prev on pravastatin so check ldl - goal <100, non-hdl <130 - close to goal so work on tlc and recheck at  f/u  Trigger finger, acquired - thumb spica splint x 3 mos, ok to remove at rest but wear during work and sleep, if still having Rt thumb stiffness and pain, could cons referral to hand surg at f/u. Try meloxicam qam  Need for prophylactic vaccination with combined diphtheria-tetanus-pertussis (DTP) vaccine - Plan: Tdap vaccine greater than or equal to 7yo IM  Meds ordered this encounter  Medications  . DISCONTD: metFORMIN (GLUCOPHAGE) 1000 MG tablet    Sig: Take 1 tablet (1,000 mg total) by mouth 2 (two) times daily with a meal.    Dispense:  180 tablet    Refill:  3  . metFORMIN (GLUCOPHAGE) 1000 MG tablet    Sig: Take 1 tablet (1,000 mg total) by mouth 2 (two) times daily with a meal.  Dispense:  180 tablet    Refill:  3  . aspirin EC 81 MG tablet    Sig: Take 1 tablet (81 mg total) by mouth daily.  Marland Kitchen glipiZIDE (GLUCOTROL XL) 2.5 MG 24 hr tablet    Sig: Take 1 tablet (2.5 mg total) by mouth daily with breakfast.    Dispense:  90 tablet    Refill:  0  . meloxicam (MOBIC) 15 MG tablet    Sig: Take 1 tablet (15 mg total) by mouth daily.    Dispense:  30 tablet    Refill:  1    I personally performed the services described in this documentation, which was scribed in my presence. The recorded information has been reviewed and considered, and addended by me as needed.  Norberto Sorenson, MD MPH

## 2014-04-18 NOTE — Patient Instructions (Addendum)
Do not use the meloxicam with any other otc pain medication other than tylenol/acetaminophen - so no aleve, ibuprofen, motrin, advil, etc.   Trigger Finger Trigger finger (digital tendinitis and stenosing tenosynovitis) is a common disorder that causes an often painful catching of the fingers or thumb. It occurs as a clicking, snapping, or locking of a finger in the palm of the hand. This is caused by a problem with the tendons that flex or bend the fingers sliding smoothly through their sheaths. The condition may occur in any finger or a couple fingers at the same time.  The finger may lock with the finger curled or suddenly straighten out with a snap. This is more common in patients with rheumatoid arthritis and diabetes. Left untreated, the condition may get worse to the point where the finger becomes locked in flexion, like making a fist, or less commonly locked with the finger straightened out. CAUSES   Inflammation and scarring that lead to swelling around the tendon sheath.  Repeated or forceful movements.  Rheumatoid arthritis, an autoimmune disease that affects joints.  Gout.  Diabetes mellitus. SIGNS AND SYMPTOMS  Soreness and swelling of your finger.  A painful clicking or snapping as you bend and straighten your finger. DIAGNOSIS  Your health care provider will do a physical exam of your finger to diagnose trigger finger. TREATMENT   Splinting for 6-8 weeks may be helpful.  Nonsteroidal anti-inflammatory medicines (NSAIDs) can help to relieve the pain and inflammation.  Cortisone injections, along with splinting, may speed up recovery. Several injections may be required. Cortisone may give relief after one injection.  Surgery is another treatment that may be used if conservative treatments do not work. Surgery can be minor, without incisions (a cut does not have to be made), and can be done with a needle through the skin.  Other surgical choices involve an open procedure  in which the surgeon opens the hand through a small incision and cuts the pulley so the tendon can again slide smoothly. Your hand will still work fine. HOME CARE INSTRUCTIONS  Apply ice to the injured area, twice per day:  Put ice in a plastic bag.  Place a towel between your skin and the bag.  Leave the ice on for 20 minutes, 3-4 times a day.  Rest your hand often. MAKE SURE YOU:   Understand these instructions.  Will watch your condition.  Will get help right away if you are not doing well or get worse. Document Released: 07/02/2004 Document Revised: 05/15/2013 Document Reviewed: 02/12/2013 [facility] Patient Information 2015 [place], [place]. This information is not intended to replace advice given to you by your health care provider. Make sure you discuss any questions you have with your health care provider.

## 2014-04-19 ENCOUNTER — Encounter: Payer: Self-pay | Admitting: Family Medicine

## 2014-04-19 DIAGNOSIS — E1169 Type 2 diabetes mellitus with other specified complication: Secondary | ICD-10-CM | POA: Insufficient documentation

## 2014-04-19 DIAGNOSIS — E785 Hyperlipidemia, unspecified: Secondary | ICD-10-CM | POA: Insufficient documentation

## 2014-04-19 DIAGNOSIS — E119 Type 2 diabetes mellitus without complications: Secondary | ICD-10-CM | POA: Insufficient documentation

## 2014-04-19 LAB — MICROALBUMIN / CREATININE URINE RATIO
Creatinine, Urine: 169.5 mg/dL
Microalb Creat Ratio: 2.9 mg/g (ref 0.0–30.0)
Microalb, Ur: 0.5 mg/dL (ref 0.00–1.89)

## 2014-04-19 LAB — COMPLETE METABOLIC PANEL WITH GFR
ALT: 37 U/L (ref 0–53)
AST: 25 U/L (ref 0–37)
Albumin: 4.2 g/dL (ref 3.5–5.2)
Alkaline Phosphatase: 56 U/L (ref 39–117)
BUN: 11 mg/dL (ref 6–23)
CALCIUM: 9.4 mg/dL (ref 8.4–10.5)
CHLORIDE: 104 meq/L (ref 96–112)
CO2: 23 mEq/L (ref 19–32)
CREATININE: 0.86 mg/dL (ref 0.50–1.35)
Glucose, Bld: 132 mg/dL — ABNORMAL HIGH (ref 70–99)
POTASSIUM: 3.9 meq/L (ref 3.5–5.3)
SODIUM: 141 meq/L (ref 135–145)
TOTAL PROTEIN: 6.6 g/dL (ref 6.0–8.3)
Total Bilirubin: 1 mg/dL (ref 0.2–1.2)

## 2014-04-19 LAB — TSH: TSH: 0.641 u[IU]/mL (ref 0.350–4.500)

## 2014-04-19 LAB — LIPID PANEL
Cholesterol: 161 mg/dL (ref 0–200)
HDL: 37 mg/dL — ABNORMAL LOW (ref >39)
LDL CALC: 109 mg/dL — AB (ref 0–99)
TRIGLYCERIDES: 76 mg/dL (ref <150)
Total CHOL/HDL Ratio: 4.4 Ratio
VLDL: 15 mg/dL (ref 0–40)

## 2014-04-19 LAB — SEDIMENTATION RATE: SED RATE: 1 mm/h (ref 0–16)

## 2014-11-03 ENCOUNTER — Encounter (HOSPITAL_COMMUNITY): Payer: Self-pay | Admitting: Emergency Medicine

## 2014-11-03 ENCOUNTER — Emergency Department (HOSPITAL_COMMUNITY)
Admission: EM | Admit: 2014-11-03 | Discharge: 2014-11-04 | Disposition: A | Discharge status: Home / Self Care | Payer: 59 | Attending: Emergency Medicine | Admitting: Emergency Medicine

## 2014-11-03 DIAGNOSIS — Z7982 Long term (current) use of aspirin: Secondary | ICD-10-CM | POA: Diagnosis not present

## 2014-11-03 DIAGNOSIS — E119 Type 2 diabetes mellitus without complications: Secondary | ICD-10-CM | POA: Diagnosis not present

## 2014-11-03 DIAGNOSIS — Z8639 Personal history of other endocrine, nutritional and metabolic disease: Secondary | ICD-10-CM | POA: Diagnosis not present

## 2014-11-03 DIAGNOSIS — R1110 Vomiting, unspecified: Secondary | ICD-10-CM | POA: Diagnosis present

## 2014-11-03 DIAGNOSIS — Z791 Long term (current) use of non-steroidal anti-inflammatories (NSAID): Secondary | ICD-10-CM | POA: Insufficient documentation

## 2014-11-03 DIAGNOSIS — R112 Nausea with vomiting, unspecified: Secondary | ICD-10-CM | POA: Diagnosis not present

## 2014-11-03 LAB — BASIC METABOLIC PANEL
ANION GAP: 7 (ref 5–15)
BUN: 10 mg/dL (ref 6–23)
CALCIUM: 9.1 mg/dL (ref 8.4–10.5)
CO2: 26 mmol/L (ref 19–32)
CREATININE: 0.78 mg/dL (ref 0.50–1.35)
Chloride: 98 mmol/L (ref 96–112)
Glucose, Bld: 200 mg/dL — ABNORMAL HIGH (ref 70–99)
Potassium: 3.8 mmol/L (ref 3.5–5.1)
SODIUM: 131 mmol/L — AB (ref 135–145)

## 2014-11-03 LAB — CBC
HCT: 45 % (ref 39.0–52.0)
Hemoglobin: 15.9 g/dL (ref 13.0–17.0)
MCH: 29.2 pg (ref 26.0–34.0)
MCHC: 35.3 g/dL (ref 30.0–36.0)
MCV: 82.7 fL (ref 78.0–100.0)
Platelets: 194 10*3/uL (ref 150–400)
RBC: 5.44 MIL/uL (ref 4.22–5.81)
RDW: 12.7 % (ref 11.5–15.5)
WBC: 6.2 10*3/uL (ref 4.0–10.5)

## 2014-11-03 NOTE — ED Notes (Signed)
pts CBG 188 reported to RN

## 2014-11-03 NOTE — ED Notes (Signed)
Pt reports that he started vomiting around noon today- last episode of vomiting was around 730pm.  Pt admits to having a headache and note feeling well since yesterday, denies diarrhea.  Pt reports he checked his blood sugar 5 days ago and it was 200.  Denies abdominal discomfort.

## 2014-11-04 LAB — HEPATIC FUNCTION PANEL
ALBUMIN: 4.1 g/dL (ref 3.5–5.2)
ALK PHOS: 49 U/L (ref 39–117)
ALT: 17 U/L (ref 0–53)
AST: 20 U/L (ref 0–37)
Bilirubin, Direct: 0.1 mg/dL (ref 0.0–0.5)
Indirect Bilirubin: 0.8 mg/dL (ref 0.3–0.9)
Total Bilirubin: 0.9 mg/dL (ref 0.3–1.2)
Total Protein: 6.8 g/dL (ref 6.0–8.3)

## 2014-11-04 LAB — CBG MONITORING, ED: Glucose-Capillary: 188 mg/dL — ABNORMAL HIGH (ref 70–99)

## 2014-11-04 LAB — LIPASE, BLOOD: LIPASE: 21 U/L (ref 11–59)

## 2014-11-04 MED ORDER — METOCLOPRAMIDE HCL 5 MG/ML IJ SOLN
10.0000 mg | Freq: Once | INTRAMUSCULAR | Status: AC
  Administered 2014-11-04: 10 mg via INTRAVENOUS
  Filled 2014-11-04: qty 2

## 2014-11-04 MED ORDER — DIPHENHYDRAMINE HCL 12.5 MG/5ML PO ELIX
12.5000 mg | ORAL_SOLUTION | Freq: Once | ORAL | Status: AC
  Administered 2014-11-04: 12.5 mg via ORAL
  Filled 2014-11-04: qty 10

## 2014-11-04 MED ORDER — PROMETHAZINE HCL 25 MG PO TABS: 25.0000 mg | ORAL_TABLET | Freq: Four times a day (QID) | ORAL | Status: DC | PRN

## 2014-11-04 MED ORDER — SODIUM CHLORIDE 0.9 % IV BOLUS (SEPSIS)
1000.0000 mL | Freq: Once | INTRAVENOUS | Status: AC
  Administered 2014-11-04: 1000 mL via INTRAVENOUS

## 2014-11-04 NOTE — ED Provider Notes (Signed)
CSN: [id number]     Arrival date & time 11/03/14  2030 History   First MD Initiated Contact with Patient 11/04/14 0001     Chief Complaint  Patient presents with  . Emesis     Patient is a 39 y.o. male presenting with vomiting. The history is provided by the patient. No language interpreter was used.  Emesis  Oscar Fernandez presents for the evaluation of vomiting. He reports vomiting since noon today. He reports multiple episodes of vomiting and he has upper abdominal pain prior to vomiting episodes. He has no abdominal pain between episodes. He reports feeling generalized weakness and mild headache. He denies any fevers, coughing, chest pain, diarrhea, dysuria. He has a history of diabetes and takes metformin. He has no history of abdominal surgeries, no history of sick contacts or possible bad food exposures. Symptoms are moderate, constant, worsening.  Past Medical History  Diagnosis Date  . Diabetes mellitus without complication   . Hyperlipidemia    History reviewed. No pertinent past surgical history. No family history on file. History  Substance Use Topics  . Smoking status: Never Smoker   . Smokeless tobacco: Not on file  . Alcohol Use: No    Review of Systems  Gastrointestinal: Positive for vomiting.  All other systems reviewed and are negative.     Allergies  Review of patient's allergies indicates no known allergies.  Home Medications   Prior to Admission medications   Medication Sig Start Date End Date Taking? Authorizing Provider  acetaminophen (TYLENOL) 500 MG tablet Take 1,000 mg by mouth every 6 (six) hours as needed for mild pain.   Yes Historical Provider, MD  metFORMIN (GLUCOPHAGE) 1000 MG tablet Take 1 tablet (1,000 mg total) by mouth 2 (two) times daily with a meal. 04/18/14  Yes Sherren Mocha, MD  aspirin EC 81 MG tablet Take 1 tablet (81 mg total) by mouth daily. Patient not taking: Reported on 11/04/2014 04/18/14   Sherren Mocha, MD  glipiZIDE (GLUCOTROL XL) 2.5  MG 24 hr tablet Take 1 tablet (2.5 mg total) by mouth daily with breakfast. Patient not taking: Reported on 11/04/2014 04/18/14   Sherren Mocha, MD  meloxicam (MOBIC) 15 MG tablet Take 1 tablet (15 mg total) by mouth daily. Patient not taking: Reported on 11/04/2014 04/18/14   Sherren Mocha, MD   BP 144/70 mmHg  Pulse 65  Temp(Src) 98.4 F (36.9 C) (Oral)  Resp 16  Ht  (1.702 m)  SpO2 98% Physical Exam  Constitutional: He is oriented to person, place, and time. He appears well-developed and well-nourished.  HENT:  Head: Normocephalic and atraumatic.  Cardiovascular: Normal rate and regular rhythm.   No murmur heard. Pulmonary/Chest: Effort normal and breath sounds normal. No respiratory distress.  Abdominal: Soft. There is no tenderness. There is no rebound and no guarding.  Musculoskeletal: He exhibits no edema or tenderness.  Neurological: He is alert and oriented to person, place, and time.  Skin: Skin is warm and dry.  Psychiatric: He has a normal mood and affect. His behavior is normal.  Nursing note and vitals reviewed.   ED Course  Procedures (including critical care time) Labs Review Labs Reviewed  BASIC METABOLIC PANEL - Abnormal; Notable for the following:    Sodium 131 (*)    Glucose, Bld 200 (*)    All other components within normal limits  CBC  LIPASE, BLOOD  HEPATIC FUNCTION PANEL    Imaging Review No results found.  EKG Interpretation None      MDM   Final diagnoses:  Non-intractable vomiting with nausea, vomiting of unspecified type    Patient here for evaluation of vomiting. His vomiting is resolved on emergency department evaluation. Clinical picture is not consistent with cholecystitis, pancreatitis, bowel obstruction. Discussed with patient and home care for vomiting with PCP follow-up and close return precautions. Patient feeling improved on recheck and tolerating oral fluids. Clinical picture is not consistent with subarachnoid  hemorrhage.  Tilden FossaElizabeth Ozil Stettler, MD 11/04/14 Earle [id number]

## 2014-11-04 NOTE — ED Notes (Signed)
Pt. Left with all belongings and refused wheelchair 

## 2014-11-04 NOTE — Discharge Instructions (Signed)

## 2014-12-03 IMAGING — CR DG HAND COMPLETE 3+V*R*
2 series · 2 of 2 positions shown · non-contrast
Comparison: None.

CLINICAL DATA: Right thumb pain

EXAM:
RIGHT HAND - COMPLETE 3+ VIEW

[PA]
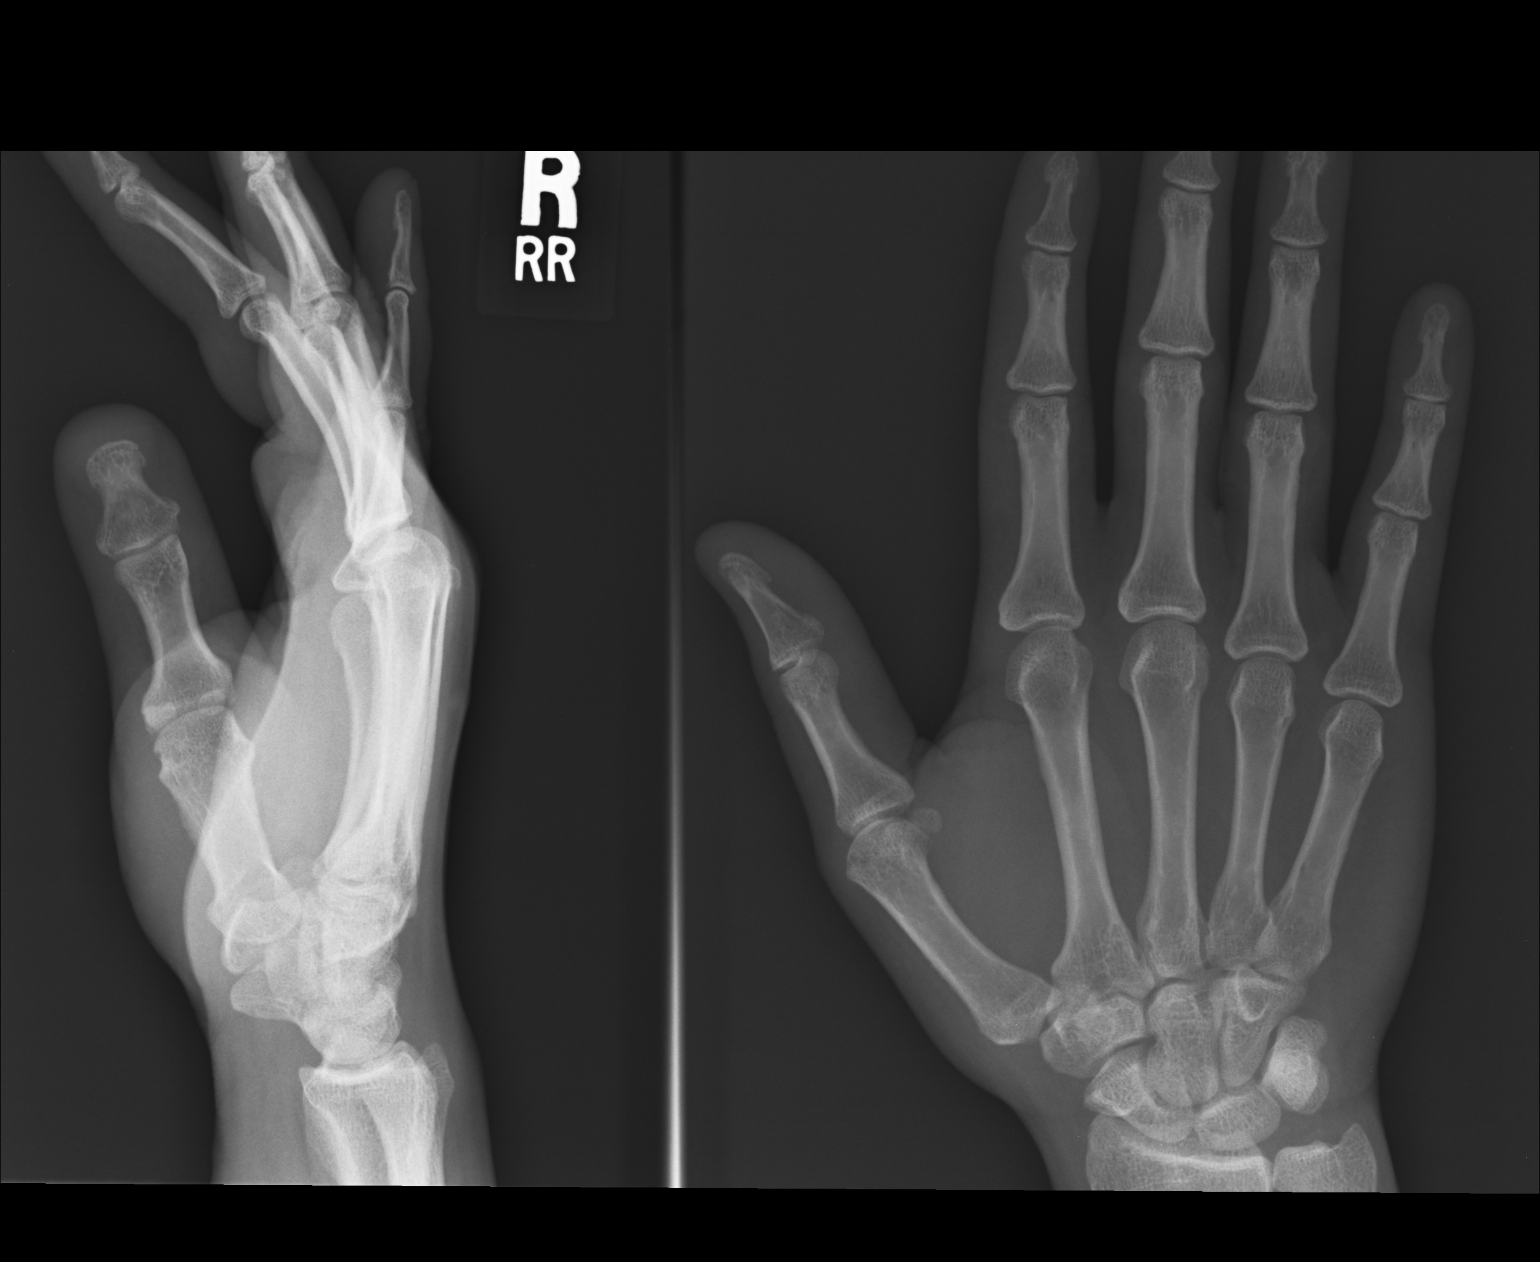

[oblique]
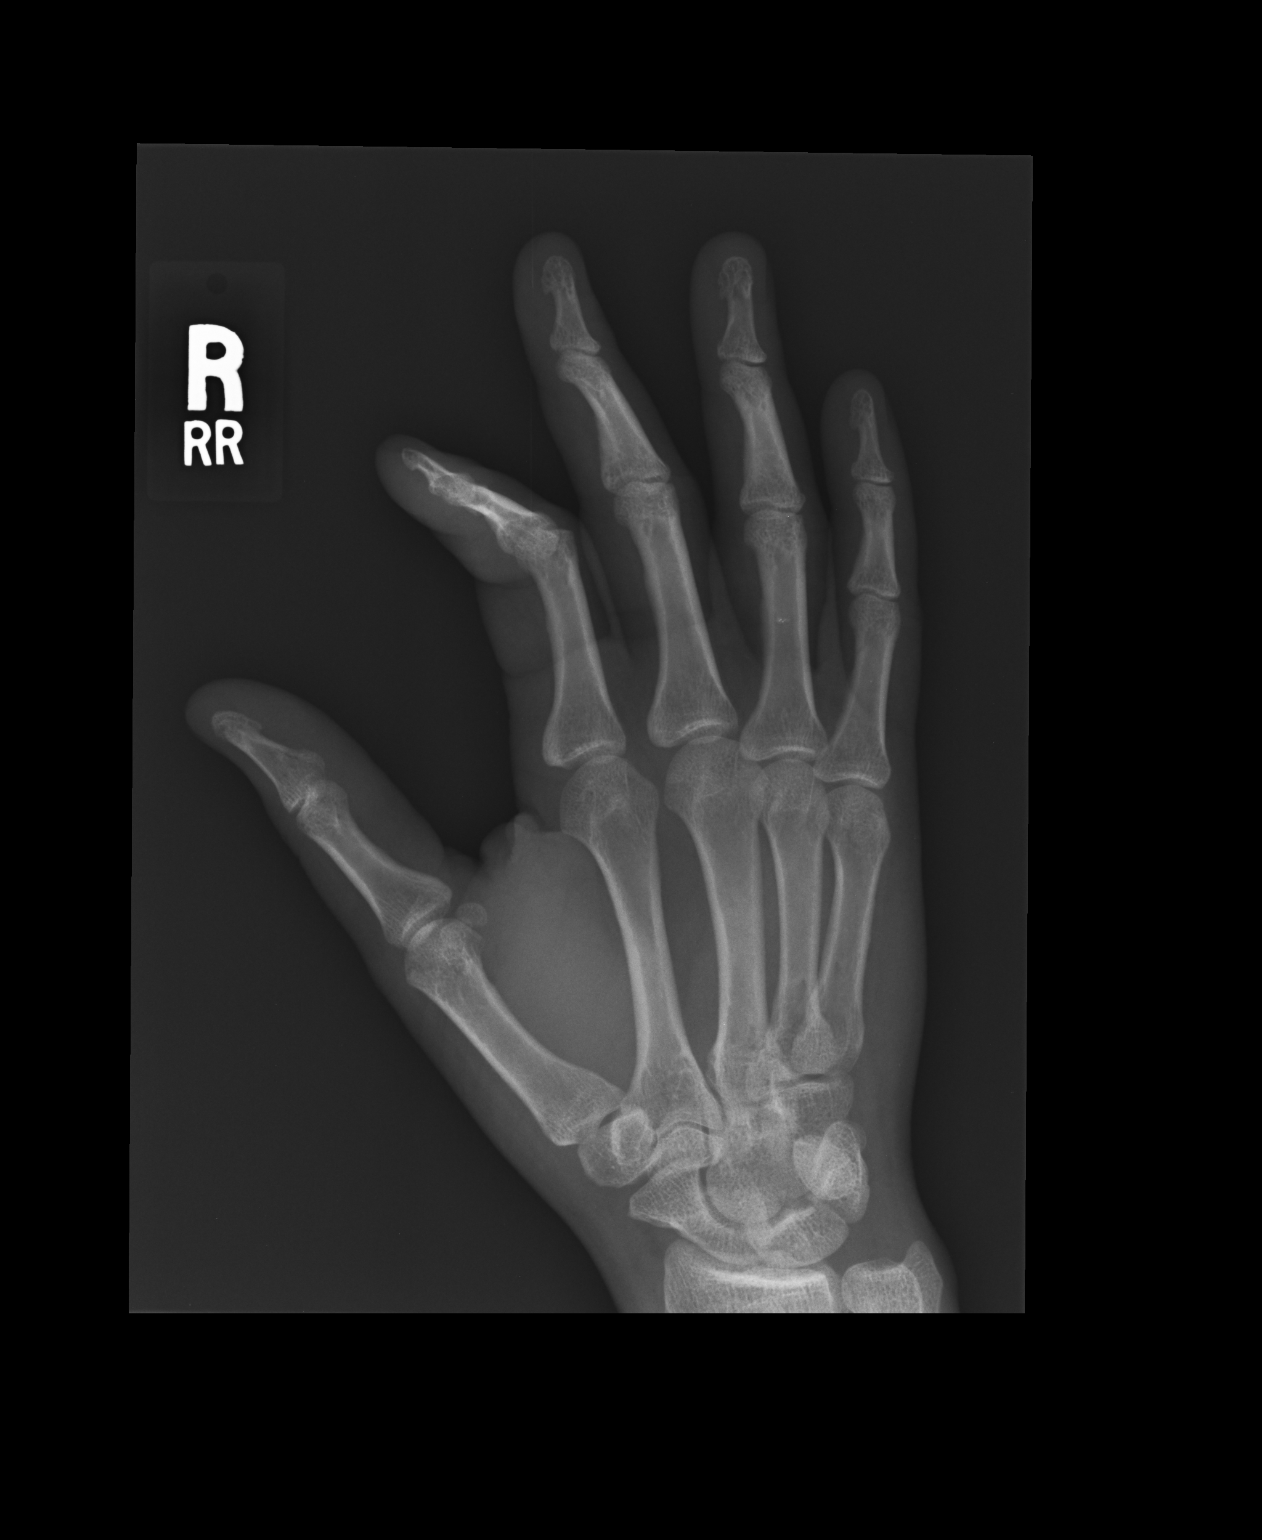

[2 of 2 positions shown; findings below may reference images not displayed]

FINDINGS: There is no evidence of fracture or dislocation. There is no
evidence of arthropathy or other focal bone abnormality. Soft
tissues are unremarkable.
IMPRESSION: Negative.

## 2015-04-28 ENCOUNTER — Other Ambulatory Visit: Payer: Self-pay | Admitting: Family Medicine

## 2015-05-28 ENCOUNTER — Telehealth: Payer: Self-pay | Admitting: Family Medicine

## 2015-05-28 NOTE — Telephone Encounter (Signed)
Oscar Fernandez came in saying he will run out of his Metformin Rx tomorrow. He has an appt with Dr. Clelia Croft on Sept. 15th. He's wondering if a Rx can be sent to [facility] for him so he'll have enough medication prior to his appointment. Please give the pt a call if you have any questions or concerns. Pt ph# [phone] Thank you.

## 2015-05-28 NOTE — Telephone Encounter (Signed)
Looks like Oscar Fernandez refilled #180 tablets on 04/29/15 -- he should not need refills. Is he perhaps needing a different medication?

## 2015-05-28 NOTE — Telephone Encounter (Signed)
Can we refill? I called pt he states he did not get 180 pills on 8/2 according to his chart.

## 2015-06-11 ENCOUNTER — Encounter: Payer: Self-pay | Admitting: Family Medicine

## 2015-06-11 ENCOUNTER — Ambulatory Visit: Payer: Self-pay | Admitting: Family Medicine

## 2015-06-11 ENCOUNTER — Ambulatory Visit (INDEPENDENT_AMBULATORY_CARE_PROVIDER_SITE_OTHER): Payer: BLUE CROSS/BLUE SHIELD | Admitting: Family Medicine

## 2015-06-11 VITALS — BP 102/86 | HR 76 | Temp 98.1°F | Resp 16 | Ht 66.0 in | Wt 182.6 lb

## 2015-06-11 DIAGNOSIS — E1165 Type 2 diabetes mellitus with hyperglycemia: Secondary | ICD-10-CM

## 2015-06-11 DIAGNOSIS — Z113 Encounter for screening for infections with a predominantly sexual mode of transmission: Secondary | ICD-10-CM

## 2015-06-11 DIAGNOSIS — Z23 Encounter for immunization: Secondary | ICD-10-CM

## 2015-06-11 DIAGNOSIS — E785 Hyperlipidemia, unspecified: Secondary | ICD-10-CM | POA: Diagnosis not present

## 2015-06-11 DIAGNOSIS — Z5181 Encounter for therapeutic drug level monitoring: Secondary | ICD-10-CM

## 2015-06-11 LAB — COMPREHENSIVE METABOLIC PANEL
ALBUMIN: 4 g/dL (ref 3.6–5.1)
ALT: 15 U/L (ref 9–46)
AST: 15 U/L (ref 10–40)
Alkaline Phosphatase: 65 U/L (ref 40–115)
BUN: 18 mg/dL (ref 7–25)
CALCIUM: 9.6 mg/dL (ref 8.6–10.3)
CHLORIDE: 102 mmol/L (ref 98–110)
CO2: 29 mmol/L (ref 20–31)
Creat: 0.84 mg/dL (ref 0.60–1.35)
GLUCOSE: 167 mg/dL — AB (ref 65–99)
Potassium: 4.3 mmol/L (ref 3.5–5.3)
SODIUM: 139 mmol/L (ref 135–146)
Total Bilirubin: 0.8 mg/dL (ref 0.2–1.2)
Total Protein: 6.6 g/dL (ref 6.1–8.1)

## 2015-06-11 LAB — POCT URINALYSIS DIPSTICK
Bilirubin, UA: NEGATIVE
Blood, UA: NEGATIVE
GLUCOSE UA: NEGATIVE
Ketones, UA: NEGATIVE
LEUKOCYTES UA: NEGATIVE
NITRITE UA: NEGATIVE
Protein, UA: NEGATIVE
Spec Grav, UA: 1.02
Urobilinogen, UA: 0.2
pH, UA: 7

## 2015-06-11 LAB — LIPID PANEL
CHOLESTEROL: 220 mg/dL — AB (ref 125–200)
HDL: 37 mg/dL — AB (ref >=40)
LDL Cholesterol: 168 mg/dL — ABNORMAL HIGH (ref <130)
Total CHOL/HDL Ratio: 5.9 Ratio — ABNORMAL HIGH (ref <=5.0)
Triglycerides: 76 mg/dL (ref <150)
VLDL: 15 mg/dL (ref <30)

## 2015-06-11 LAB — MICROALBUMIN / CREATININE URINE RATIO
Creatinine, Urine: 175.7 mg/dL
MICROALB UR: 0.6 mg/dL (ref <2.0)
MICROALB/CREAT RATIO: 3.4 mg/g (ref 0.0–30.0)

## 2015-06-11 LAB — HIV ANTIBODY (ROUTINE TESTING W REFLEX): HIV: NONREACTIVE

## 2015-06-11 LAB — POCT GLYCOSYLATED HEMOGLOBIN (HGB A1C): HEMOGLOBIN A1C: 8.3

## 2015-06-11 LAB — TSH: TSH: 0.633 u[IU]/mL (ref 0.350–4.500)

## 2015-06-11 MED ORDER — METFORMIN HCL 1000 MG PO TABS: 1000.0000 mg | ORAL_TABLET | Freq: Two times a day (BID) | ORAL | Status: DC

## 2015-06-11 NOTE — Patient Instructions (Signed)
Call [email or web address] and can ask insurance co who they work with

## 2015-06-11 NOTE — Progress Notes (Signed)
Subjective:    Patient ID: Oscar Fernandez, male    DOB: Feb 01, 1976, 39 y.o.   MRN: [id number]  Chief Complaint  Patient presents with  . Follow-up    diabetes, will get FLU VACCINE at work  . Medication Refill    METFORMIN and GLIPIZIDE  PEND    HPI  Pt with type 2 DM, uncontrolled. He recurrently does not follow-up and ends up having to go off his medicines and then a1c is uncontrolled. Was 12.7 -> 7.8 so started glucotrol at last visit (which was 14 mos prior when pt was instructed to f/u in 3 mos). Has been on metformin 1000 bid (didn't get the refill that was called in for him??) and glucotrol xl 2.5 which he is also out of. Was only out of metformin for 3-4d but ran out of glucotrol xl sev mos ago - didn't have any and didn't really check on any refills directly with the pharmacy. No on acei or statin. No taking asa. acei held as BP runs low at baseline and prior microalb normal.  Lipid panel was close to goal so advised to work on tlc at last visit.  Was started on pravastatin prior but pt doesn't remember if he tried it. tdap done 2015 Thinks he saw an eye doctor 2 yrs ago but not sure who or where. Fasting cbg 150s.  States he hasn't f/u as instructed because he has been feeling fine.  Try to cut sugar from diet - using splenda - but has a tendency to eat carbs which he sees reflected in his higher sugars.  Wants to know how to improve his a1c.  Depression screen [facility] 2/9 06/11/2015 04/18/2014  Decreased Interest 0 0  Down, Depressed, Hopeless 0 0  PHQ - 2 Score 0 0    Past Medical History  Diagnosis Date  . Diabetes mellitus without complication   . Hyperlipidemia    Current Outpatient Prescriptions on File Prior to Visit  Medication Sig Dispense Refill  . acetaminophen (TYLENOL) 500 MG tablet Take 1,000 mg by mouth every 6 (six) hours as needed for mild pain.    Marland Kitchen aspirin EC 81 MG tablet Take 1 tablet (81 mg total) by mouth daily. (Patient not taking: Reported on 11/04/2014)      No current facility-administered medications on file prior to visit.   No Known Allergies    Review of Systems  Constitutional: Negative for fever, chills, activity change, appetite change and unexpected weight change.  Eyes: Negative for visual disturbance.  Respiratory: Negative for chest tightness and shortness of breath.   Cardiovascular: Negative for chest pain, palpitations and leg swelling.  Endocrine: Negative for polydipsia, polyphagia and polyuria.  Neurological: Negative for dizziness, syncope, facial asymmetry, weakness, light-headedness and numbness.  Psychiatric/Behavioral: Negative for dysphoric mood. The patient is not nervous/anxious.        Objective:  BP 102/86 mmHg  Pulse 76  Temp(Src) 98.1 F (36.7 C) (Oral)  Resp 16  Ht  (1.676 m)  Wt 182 lb 9.6 oz (82.827 kg)  BMI 29.49 kg/m2  Physical Exam  Constitutional: He is oriented to person, place, and time. He appears well-developed and well-nourished. No distress.  HENT:  Head: Normocephalic and atraumatic.  Eyes: Conjunctivae are normal. Pupils are equal, round, and reactive to light. No scleral icterus.  Neck: Normal range of motion. Neck supple. No thyromegaly present.  Cardiovascular: Normal rate, regular rhythm, normal heart sounds and intact distal pulses.   Pulmonary/Chest: Effort normal  and breath sounds normal. No respiratory distress.  Musculoskeletal: He exhibits no edema.  Lymphadenopathy:    He has no cervical adenopathy.  Neurological: He is alert and oriented to person, place, and time.  Skin: Skin is warm and dry. He is not diaphoretic.  Psychiatric: He has a normal mood and affect. His behavior is normal.      Diabetic Foot Exam - Simple   Simple Foot Form  Diabetic Foot exam was performed with the following findings:  Yes 06/11/2015 10:55 AM  Visual Inspection  Sensation Testing  Pulse Check  Comments       [facility] reading (PRIMARY) by  Dr. Clelia Croft. EKG: NSR, no ischemic  changes Assessment & Plan:   Cont metformin, restart glucotrol, restart pravastatin  1. Type 2 diabetes mellitus with hyperglycemia - pneumovax today, will get flu shot at work.  Refer to optho. nml DM foot exam today. Off metformin x 3-4d and has not taken gluctorol in >3 mos which was newly started at last visit. Restart metformin but will try sglt2 inh rather than sulfonylurea to help with weight loss.  Pt has very poor compliance with DM diet and meds which mainly seems to be due to a poor understanding of DM and disease process.  Informed pt of DM side effects which are irreversible from uncontrolled sugars and advise dec carbs. Refer to DM ed for detailed info on diet.  Ok to hold off on acei as low-nml bp and neg microalb  2. Hyperlipidemia LDL goal <100 - start statin  3. Medication monitoring encounter   4. Screening for STD (sexually transmitted disease)     Orders Placed This Encounter  Procedures  . Pneumococcal polysaccharide vaccine 23-valent greater than or equal to 2yo subcutaneous/IM  . Lipid panel    Order Specific Question:  Has the patient fasted?    Answer:  Yes  . Comprehensive metabolic panel    Order Specific Question:  Has the patient fasted?    Answer:  Yes  . TSH  . Microalbumin/Creatinine Ratio, Urine  . HIV antibody  . Ambulatory referral to Ophthalmology    Referral Priority:  Routine    Referral Type:  Consultation    Referral Reason:  Specialty Services Required    Requested Specialty:  Ophthalmology    Number of Visits Requested:  1  . POCT glycosylated hemoglobin (Hb A1C)  . POCT urinalysis dipstick  . EKG 12-Lead  . HM Diabetes Foot Exam    Meds ordered this encounter  Medications  . metFORMIN (GLUCOPHAGE) 1000 MG tablet    Sig: Take 1 tablet (1,000 mg total) by mouth 2 (two) times daily with a meal.    Dispense:  180 tablet    Refill:  3  . pravastatin (PRAVACHOL) 40 MG tablet    Sig: Take 1 tablet (40 mg total) by mouth daily.     Dispense:  90 tablet    Refill:  1  . canagliflozin (INVOKANA) 100 MG TABS tablet    Sig: Take 1 tablet (100 mg total) by mouth daily.    Dispense:  30 tablet    Refill:  3    I personally performed the services described in this documentation, which was scribed in my presence. The recorded information has been reviewed and considered, and addended by me as needed.  Norberto Sorenson, MD MPH

## 2015-06-12 ENCOUNTER — Encounter: Payer: Self-pay | Admitting: Family Medicine

## 2015-06-12 MED ORDER — PRAVASTATIN SODIUM 40 MG PO TABS: 40.0000 mg | ORAL_TABLET | Freq: Every day | ORAL | Status: DC

## 2015-06-12 MED ORDER — CANAGLIFLOZIN 100 MG PO TABS: 100.0000 mg | ORAL_TABLET | Freq: Every day | ORAL | Status: DC

## 2015-06-23 LAB — HM DIABETES EYE EXAM

## 2015-07-13 ENCOUNTER — Ambulatory Visit: Payer: Self-pay | Admitting: Dietician

## 2015-07-17 ENCOUNTER — Encounter: Payer: Self-pay | Admitting: Family Medicine

## 2015-08-25 ENCOUNTER — Encounter: Payer: BLUE CROSS/BLUE SHIELD | Attending: Family Medicine | Admitting: Dietician

## 2015-08-25 ENCOUNTER — Encounter: Payer: Self-pay | Admitting: Dietician

## 2015-08-25 VITALS — Ht 67.0 in | Wt 186.9 lb

## 2015-08-25 DIAGNOSIS — E1165 Type 2 diabetes mellitus with hyperglycemia: Secondary | ICD-10-CM | POA: Insufficient documentation

## 2015-08-25 DIAGNOSIS — Z713 Dietary counseling and surveillance: Secondary | ICD-10-CM | POA: Insufficient documentation

## 2015-08-25 DIAGNOSIS — E118 Type 2 diabetes mellitus with unspecified complications: Secondary | ICD-10-CM

## 2015-08-25 DIAGNOSIS — E785 Hyperlipidemia, unspecified: Secondary | ICD-10-CM | POA: Diagnosis not present

## 2015-08-25 DIAGNOSIS — IMO0002 Reserved for concepts with insufficient information to code with codable children: Secondary | ICD-10-CM

## 2015-08-25 NOTE — Progress Notes (Signed)
Diabetes Self-Management Education  Visit Type: First/Initial  Appt. Start Time: 1415 Appt. End Time: 1515  08/25/2015  [name], identified by name and date of birth, is a 39 y.o. male with a diagnosis of Diabetes: Type 2.   Patient is here alone.  He lives with his wife and 3 daughters ages 62,5, and 3yo.  His wife does the shopping and cooking.  He has been skipping a lot of meals because he is not hungry and he is trying to lose weight.  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    Traditionally his diet has increased amounts of oil and he has eaten fufu regularly (39 grams carbs per 1/2 cup) but he reports that he has asked his wife not to buy this anymore.  They have some american fast food as well.    Lipids noted: cholesterol 220, Triglycerides 76, HDL 37, LDL 168.  ASSESSMENT  Height  (1.702 m), weight 186 lb 14.4 oz (84.777 kg). Body mass index is 29.27 kg/(m^2).      Diabetes Self-Management Education - 08/25/15 1417    Visit Information   Visit Type First/Initial   Initial Visit   Diabetes Type Type 2   Are you currently following a meal plan? No   Are you taking your medications as prescribed? Yes  Taking Metformin as prescribed   Date Diagnosed around 2010   Health Coping   How would you rate your overall health? Good   Psychosocial Assessment   Patient Belief/Attitude about Diabetes Motivated to manage diabetes   Self-care barriers None   Self-management support Doctor's office   Other persons present Patient   Patient Concerns Nutrition/Meal planning;Weight Control;Glycemic Control   Special Needs None   Preferred Learning Style No preference indicated   Learning Readiness Ready   How often do you need to have someone help you when you read instructions, pamphlets, or other written materials from your doctor or pharmacy? 1 - Never   What is the last grade level you completed in school? 4  years college in [place]   Complications   Last HgB A1C per patient/outside source 8.3 %  06/11/15   How often do you check your blood sugar? 3-4 times / week  twice a week   Fasting Blood glucose range (mg/dL) 161-096  045-409   Number of hypoglycemic episodes per month 0   Number of hyperglycemic episodes per week 0   Have you had a dilated eye exam in the past 12 months? Yes   Have you had a dental exam in the past 12 months? No   Are you checking your feet? Yes   How many days per week are you checking your feet? 2   Dietary Intake   Breakfast none   Snack (morning) none   Lunch none, french fries today   Snack (afternoon) none OR leftovers OR chips OR apples   Dinner french fries OR spaghetti OR other leftovers OR black eyed peas, OR meat, rice or fufu, vegetables (salad)   Snack (evening) chips   Beverage(s) sugar free Energy Drink, sugar free juice, water, no longer drinks soda   Exercise   Exercise Type Moderate (swimming / aerobic walking)  [employer] on Saturday and Sunday   How many days per week to you exercise? 2   How many minutes per day do you exercise? 60   Total minutes per week  of exercise 120   Patient Education   Previous Diabetes Education No   Disease state  Definition of diabetes, type 1 and 2, and the diagnosis of diabetes;Explored patient's options for treatment of their diabetes   Nutrition management  Role of diet in the treatment of diabetes and the relationship between the three main macronutrients and blood glucose level;Food label reading, portion sizes and measuring food.;Meal options for control of blood glucose level and chronic complications.   Physical activity and exercise  Role of exercise on diabetes management, blood pressure control and cardiac health.   Monitoring Identified appropriate SMBG and/or A1C goals.;Yearly dilated eye exam;Daily foot exams   Chronic complications Relationship between chronic complications and blood glucose control    Psychosocial adjustment Worked with patient to identify barriers to care and solutions;Role of stress on diabetes   Individualized Goals (developed by patient)   Nutrition General guidelines for healthy choices and portions discussed   Physical Activity Exercise 5-7 days per week;30 minutes per day   Medications take my medication as prescribed   Reducing Risk do foot checks daily;examine blood glucose patterns   Outcomes   Expected Outcomes Demonstrated interest in learning. Expect positive outcomes   Future DMSE PRN   Program Status Completed      Individualized Plan for Diabetes Self-Management Training:   Learning Objective:  Patient will have a greater understanding of diabetes self-management. Patient education plan is to attend individual and/or group sessions per assessed needs and concerns.   Guidelines also given with respect to hyperlipidemia. I encouraged him to call his MD to obtain new prescriptions for the Invokana and Pravachol as he has run out.  Plan:   Patient Instructions  Bake rather than fry. Use less oil in cooking. Continue to take your medication as the doctor ordered.  Call her office if you need another prescription. Try to exercise most days of the week.   Aim for 3-4 carbohydrate servings per meal (45-60 grams) Aim for 0-1 carbohydrate servings for snack in hungry. Eat more frequently.  Try to have 3 meals daily.  Avoid skipping. Eat a lot of non starch vegetables and small amounts of meat   Expected Outcomes:  Demonstrated interest in learning. Expect positive outcomes  Education material provided: Living Well with Diabetes, Food label handouts, A1C conversion sheet, Meal plan card, My Plate and Snack sheet  If problems or questions, patient to contact team via:  Phone and Email  Future DSME appointment: PRN

## 2015-08-25 NOTE — Patient Instructions (Signed)
Bake rather than fry. Use less oil in cooking. Continue to take your medication as the doctor ordered.  Call her office if you need another prescription. Try to exercise most days of the week.   Aim for 3-4 carbohydrate servings per meal (45-60 grams) Aim for 0-1 carbohydrate servings for snack in hungry. Eat more frequently.  Try to have 3 meals daily.  Avoid skipping. Eat a lot of non starch vegetables and small amounts of meat

## 2015-10-22 ENCOUNTER — Ambulatory Visit: Payer: BLUE CROSS/BLUE SHIELD | Admitting: Family Medicine

## 2015-10-27 ENCOUNTER — Other Ambulatory Visit: Payer: Self-pay | Admitting: Family Medicine

## 2015-10-29 ENCOUNTER — Ambulatory Visit (INDEPENDENT_AMBULATORY_CARE_PROVIDER_SITE_OTHER): Payer: BLUE CROSS/BLUE SHIELD | Admitting: Family Medicine

## 2015-10-29 ENCOUNTER — Encounter: Payer: Self-pay | Admitting: Family Medicine

## 2015-10-29 VITALS — BP 117/77 | HR 77 | Temp 98.1°F | Resp 16 | Wt 174.8 lb

## 2015-10-29 DIAGNOSIS — E1165 Type 2 diabetes mellitus with hyperglycemia: Secondary | ICD-10-CM | POA: Diagnosis not present

## 2015-10-29 DIAGNOSIS — Z5181 Encounter for therapeutic drug level monitoring: Secondary | ICD-10-CM | POA: Diagnosis not present

## 2015-10-29 DIAGNOSIS — E785 Hyperlipidemia, unspecified: Secondary | ICD-10-CM

## 2015-10-29 LAB — LIPID PANEL
CHOL/HDL RATIO: 3.7 ratio (ref <=5.0)
Cholesterol: 122 mg/dL — ABNORMAL LOW (ref 125–200)
HDL: 33 mg/dL — AB (ref >=40)
LDL CALC: 78 mg/dL (ref <130)
TRIGLYCERIDES: 54 mg/dL (ref <150)
VLDL: 11 mg/dL (ref <30)

## 2015-10-29 LAB — COMPREHENSIVE METABOLIC PANEL
ALT: 23 U/L (ref 9–46)
AST: 19 U/L (ref 10–40)
Albumin: 4.2 g/dL (ref 3.6–5.1)
Alkaline Phosphatase: 53 U/L (ref 40–115)
BUN: 17 mg/dL (ref 7–25)
CO2: 27 mmol/L (ref 20–31)
Calcium: 9.4 mg/dL (ref 8.6–10.3)
Chloride: 103 mmol/L (ref 98–110)
Creat: 0.76 mg/dL (ref 0.60–1.35)
Glucose, Bld: 144 mg/dL — ABNORMAL HIGH (ref 65–99)
Potassium: 4 mmol/L (ref 3.5–5.3)
Sodium: 139 mmol/L (ref 135–146)
Total Bilirubin: 0.8 mg/dL (ref 0.2–1.2)
Total Protein: 6.7 g/dL (ref 6.1–8.1)

## 2015-10-29 LAB — HEMOGLOBIN A1C
Hgb A1c MFr Bld: 7.4 % — ABNORMAL HIGH (ref <5.7)
Mean Plasma Glucose: 166 mg/dL — ABNORMAL HIGH (ref <117)

## 2015-10-29 NOTE — Progress Notes (Signed)
Subjective:    Patient ID: Oscar Fernandez, male    DOB: 1976/08/17, 40 y.o.   MRN: [id number] Chief Complaint  Patient presents with  . Diabetes    follow up  . Medication Refill    Invokana    HPI  Oscar Fernandez is a delightful 40 yo male orginally from [place], last seen here 4-5 mos prior, here for f/u of type II DM.  DM:  At his last OV he has been off his metformin for sev d and off of his glipizide for >3 mos resulting in a1c 8.3 so restarted metformin 1000 bid and started invokana 100.  Referred to DM ed at last visit and pt did see nutritionist once which he found very helpful and has been able to make some good diet changes.  Started asa occ.  Questionable medication compliance as pt told nutritionist that he had run out of all his medications 2 months after he received a 4 mo supply. Checking cbgs in the morning 2-3x/wk - usually 120s-140s.  Have been out of invokana for 3-4d - was not aware that we had sent in a rx for her. No problems tolerating any of his medication.  He receives his flu vaccine at work. Pneumovax was 2016, referred to optho at last OV and saw [facility] on 05/2015. Nml DM foot exam and urine microalb 05/2015. Weight: lost a few lbs with the diet changes!  HPL: Restart pravastatin 40 at last OV 4-5 mos prior - tolerating it well and is fasting today.  Tdap done 2015  Past Medical History  Diagnosis Date  . Diabetes mellitus without complication (HCC)   . Hyperlipidemia    No past surgical history on file. Current Outpatient Prescriptions on File Prior to Visit  Medication Sig Dispense Refill  . metFORMIN (GLUCOPHAGE) 1000 MG tablet Take 1 tablet (1,000 mg total) by mouth 2 (two) times daily with a meal. 180 tablet 3  . acetaminophen (TYLENOL) 500 MG tablet Take 1,000 mg by mouth every 6 (six) hours as needed for mild pain. Reported on 10/29/2015    . aspirin EC 81 MG tablet Take 1 tablet (81 mg total) by mouth daily. (Patient not taking: Reported on 10/29/2015)      No current facility-administered medications on file prior to visit.   No Known Allergies No family history on file. Social History   Social History  . Marital Status: Married    Spouse Name: N/A  . Number of Children: N/A  . Years of Education: college   Occupational History  . industrial    Social History Main Topics  . Smoking status: Never Smoker   . Smokeless tobacco: None  . Alcohol Use: No  . Drug Use: No  . Sexual Activity: Yes   Other Topics Concern  . None   Social History Narrative      Review of Systems  Constitutional: Positive for appetite change. Negative for fever, chills, activity change and unexpected weight change.  Gastrointestinal: Negative for nausea, vomiting, abdominal pain, diarrhea and constipation.  Endocrine: Negative for polydipsia, polyphagia and polyuria.  Genitourinary: Negative for dysuria, urgency, frequency, hematuria, flank pain, decreased urine volume, discharge, penile swelling, scrotal swelling, difficulty urinating, genital sores, penile pain and testicular pain.  Musculoskeletal: Negative for myalgias, back pain, joint swelling, arthralgias and gait problem.  Skin: Negative for rash.  Neurological: Negative for weakness and numbness.       Objective:  BP 117/77 mmHg  Pulse 77  Temp(Src) 98.1 F (  36.7 C) (Oral)  Resp 16  Wt 174 lb 12.8 oz (79.289 kg)  Physical Exam  Constitutional: He is oriented to person, place, and time. He appears well-developed and well-nourished. No distress.  HENT:  Head: Normocephalic and atraumatic.  Eyes: Conjunctivae are normal. Pupils are equal, round, and reactive to light. No scleral icterus.  Neck: Normal range of motion. Neck supple. Thyromegaly present.  Cardiovascular: Normal rate, regular rhythm, normal heart sounds and intact distal pulses.   Pulmonary/Chest: Effort normal and breath sounds normal. No respiratory distress.  Musculoskeletal: He exhibits no edema.  Lymphadenopathy:     He has no cervical adenopathy.  Neurological: He is alert and oriented to person, place, and time.  Skin: Skin is warm and dry. He is not diaphoretic.  Psychiatric: He has a normal mood and affect. His behavior is normal.          Assessment & Plan:    1. Type 2 diabetes mellitus with hyperglycemia, without long-term current use of insulin (HCC) - a1c 8.3 -> 7.4 today since started invokana at last visit- better than expected from reported cbgs - cont metformin and increase invokana from 100 -> 300. Recheck in 4-6 mos. Restart asa. Not on ace/arb due to nml bp and nml urine microalb with poor med compliance.  2. Hyperlipidemia LDL goal <100 - at goal on pravastatin 40, cont.  3. Medication monitoring encounter     Orders Placed This Encounter  Procedures  . Comprehensive metabolic panel  . Lipid panel    Order Specific Question:  Has the patient fasted?    Answer:  Yes  . Hemoglobin A1c    Norberto Sorenson, MD MPH  Results for orders placed or performed in visit on 10/29/15  Comprehensive metabolic panel  Result Value Ref Range   Sodium 139 135 - 146 mmol/L   Potassium 4.0 3.5 - 5.3 mmol/L   Chloride 103 98 - 110 mmol/L   CO2 27 20 - 31 mmol/L   Glucose, Bld 144 (H) 65 - 99 mg/dL   BUN 17 7 - 25 mg/dL   Creat 1.61 0.96 - 0.45 mg/dL   Total Bilirubin 0.8 0.2 - 1.2 mg/dL   Alkaline Phosphatase 53 40 - 115 U/L   AST 19 10 - 40 U/L   ALT 23 9 - 46 U/L   Total Protein 6.7 6.1 - 8.1 g/dL   Albumin 4.2 3.6 - 5.1 g/dL   Calcium 9.4 8.6 - 40.9 mg/dL  Lipid panel  Result Value Ref Range   Cholesterol 122 (L) 125 - 200 mg/dL   Triglycerides 54 <811 mg/dL   HDL 33 (L) >=91 mg/dL   Total CHOL/HDL Ratio 3.7 <=5.0 Ratio   VLDL 11 <30 mg/dL   LDL Cholesterol 78 <478 mg/dL  Hemoglobin G9F  Result Value Ref Range   Hgb A1c MFr Bld 7.4 (H) <5.7 %   Mean Plasma Glucose 166 (H) <117 mg/dL

## 2015-10-29 NOTE — Patient Instructions (Signed)
Blood Glucose Monitoring, Adult °Monitoring your blood glucose (also know as blood sugar) helps you to manage your diabetes. It also helps you and your health care provider monitor your diabetes and determine how well your treatment plan is working. °WHY SHOULD YOU MONITOR YOUR BLOOD GLUCOSE? °· It can help you understand how food, exercise, and medicine affect your blood glucose. °· It allows you to know what your blood glucose is at any given moment. You can quickly tell if you are having low blood glucose (hypoglycemia) or high blood glucose (hyperglycemia). °· It can help you and your health care provider know how to adjust your medicines. °· It can help you understand how to manage an illness or adjust medicine for exercise. °WHEN SHOULD YOU TEST? °Your health care provider will help you decide how often you should check your blood glucose. This may depend on the type of diabetes you have, your diabetes control, or the types of medicines you are taking. Be sure to write down all of your blood glucose readings so that this information can be reviewed with your health care provider. See below for examples of testing times that your health care provider may suggest. °Type 1 Diabetes °· Test at least 2 times per day if your diabetes is well controlled, if you are using an insulin pump, or if you perform multiple daily injections. °· If your diabetes is not well controlled or if you are sick, you may need to test more often. °· It is a good idea to also test: °¨ Before every insulin injection. °¨ Before and after exercise. °¨ Between meals and 2 hours after a meal. °¨ Occasionally between 2:00 a.m. and 3:00 a.m. °Type 2 Diabetes °· If you are taking insulin, test at least 2 times per day. However, it is best to test before every insulin injection. °· If you take medicines by mouth (orally), test 2 times a day. °· If you are on a controlled diet, test once a day. °· If your diabetes is not well controlled or if you  are sick, you may need to monitor more often. °HOW TO MONITOR YOUR BLOOD GLUCOSE °Supplies Needed °· Blood glucose meter. °· Test strips for your meter. Each meter has its own strips. You must use the strips that go with your own meter. °· A pricking needle (lancet). °· A device that holds the lancet (lancing device). °· A journal or log book to write down your results. °Procedure °· Wash your hands with soap and water. Alcohol is not preferred. °· Prick the side of your finger (not the tip) with the lancet. °· Gently milk the finger until a small drop of blood appears. °· Follow the instructions that come with your meter for inserting the test strip, applying blood to the strip, and using your blood glucose meter. °Other Areas to Get Blood for Testing °Some meters allow you to use other areas of your body (other than your finger) to test your blood. These areas are called alternative sites. The most common alternative sites are: °· The forearm. °· The thigh. °· The back area of the lower leg. °· The palm of the hand. °The blood flow in these areas is slower. Therefore, the blood glucose values you get may be delayed, and the numbers are different from what you would get from your fingers. Do not use alternative sites if you think you are having hypoglycemia. Your reading will not be accurate. Always use a finger if you are   having hypoglycemia. Also, if you cannot feel your lows (hypoglycemia unawareness), always use your fingers for your blood glucose checks. °ADDITIONAL TIPS FOR GLUCOSE MONITORING °· Do not reuse lancets. °· Always carry your supplies with you. °· All blood glucose meters have a 24-hour "hotline" number to call if you have questions or need help. °· Adjust (calibrate) your blood glucose meter with a control solution after finishing a few boxes of strips. °BLOOD GLUCOSE RECORD KEEPING °It is a good idea to keep a daily record or log of your blood glucose readings. Most glucose meters, if not all,  keep your glucose records stored in the meter. Some meters come with the ability to download your records to your home computer. Keeping a record of your blood glucose readings is especially helpful if you are wanting to look for patterns. Make notes to go along with the blood glucose readings because you might forget what happened at that exact time. Keeping good records helps you and your health care provider to work together to achieve good diabetes management.  °  °This information is not intended to replace advice given to you by your health care provider. Make sure you discuss any questions you have with your health care provider. °  °Document Released: 09/15/2003 Document Revised: 10/03/2014 Document Reviewed: 02/04/2013 °Elsevier Interactive Patient Education ©2016 [employer] ° °Diabetes and Exercise °Exercising regularly is important. It is not just about losing weight. It has many health benefits, such as: °· Improving your overall fitness, flexibility, and endurance. °· Increasing your bone density. °· Helping with weight control. °· Decreasing your body fat. °· Increasing your muscle strength. °· Reducing stress and tension. °· Improving your overall health. °People with diabetes who exercise gain additional benefits because exercise: °· Reduces appetite. °· Improves the body's use of blood sugar (glucose). °· Helps lower or control blood glucose. °· Decreases blood pressure. °· Helps control blood lipids (such as cholesterol and triglycerides). °· Improves the body's use of the hormone insulin by: °¨ Increasing the body's insulin sensitivity. °¨ Reducing the body's insulin needs. °· Decreases the risk for heart disease because exercising: °¨ Lowers cholesterol and triglycerides levels. °¨ Increases the levels of good cholesterol (such as high-density lipoproteins [HDL]) in the body. °¨ Lowers blood glucose levels. °YOUR ACTIVITY PLAN  °Choose an activity that you enjoy, and set realistic goals. To  exercise safely, you should begin practicing any new physical activity slowly, and gradually increase the intensity of the exercise over time. Your health care provider or diabetes educator can help create an activity plan that works for you. General recommendations include: °· Encouraging children to engage in at least 60 minutes of physical activity each day. °· Stretching and performing strength training exercises, such as yoga or weight lifting, at least 2 times per week. °· Performing a total of at least 150 minutes of moderate-intensity exercise each week, such as brisk walking or water aerobics. °· Exercising at least 3 days per week, making sure you allow no more than 2 consecutive days to pass without exercising. °· Avoiding long periods of inactivity (90 minutes or more). When you have to spend an extended period of time sitting down, take frequent breaks to walk or stretch. °RECOMMENDATIONS FOR EXERCISING WITH TYPE 1 OR TYPE 2 DIABETES  °· Check your blood glucose before exercising. If blood glucose levels are greater than 240 mg/dL, check for urine ketones. Do not exercise if ketones are present. °· Avoid injecting insulin into areas of the   body that are going to be exercised. For example, avoid injecting insulin into: °¨ The arms when playing tennis. °¨ The legs when jogging. °· Keep a record of: °¨ Food intake before and after you exercise. °¨ Expected peak times of insulin action. °¨ Blood glucose levels before and after you exercise. °¨ The type and amount of exercise you have done. °· Review your records with your health care provider. Your health care provider will help you to develop guidelines for adjusting food intake and insulin amounts before and after exercising. °· If you take insulin or oral hypoglycemic agents, watch for signs and symptoms of hypoglycemia. They include: °¨ Dizziness. °¨ Shaking. °¨ Sweating. °¨ Chills. °¨ Confusion. °· Drink plenty of water while you exercise to prevent  dehydration or heat stroke. Body water is lost during exercise and must be replaced. °· Talk to your health care provider before starting an exercise program to make sure it is safe for you. Remember, almost any type of activity is better than none. °  °This information is not intended to replace advice given to you by your health care provider. Make sure you discuss any questions you have with your health care provider. °  °Document Released: 12/03/2003 Document Revised: 01/27/2015 Document Reviewed: 02/19/2013 °Elsevier Interactive Patient Education ©2016 [employer] ° °

## 2015-10-31 MED ORDER — PRAVASTATIN SODIUM 40 MG PO TABS: 40.0000 mg | ORAL_TABLET | Freq: Every day | ORAL | Status: DC

## 2015-10-31 MED ORDER — CANAGLIFLOZIN 300 MG PO TABS: 300.0000 mg | ORAL_TABLET | Freq: Every day | ORAL | Status: DC

## 2016-03-03 ENCOUNTER — Encounter: Payer: Self-pay | Admitting: Family Medicine

## 2016-03-03 ENCOUNTER — Ambulatory Visit (INDEPENDENT_AMBULATORY_CARE_PROVIDER_SITE_OTHER): Payer: BLUE CROSS/BLUE SHIELD | Admitting: Family Medicine

## 2016-03-03 VITALS — BP 105/72 | HR 69 | Temp 97.6°F | Resp 18 | Ht 67.0 in | Wt 178.0 lb

## 2016-03-03 DIAGNOSIS — E1165 Type 2 diabetes mellitus with hyperglycemia: Secondary | ICD-10-CM

## 2016-03-03 DIAGNOSIS — E785 Hyperlipidemia, unspecified: Secondary | ICD-10-CM

## 2016-03-03 MED ORDER — DAPAGLIFLOZIN PROPANEDIOL 5 MG PO TABS: 5.0000 mg | ORAL_TABLET | Freq: Every day | ORAL | Status: DC

## 2016-03-03 NOTE — Progress Notes (Addendum)
Subjective:    Patient ID: Oscar Fernandez, male    DOB: 1976-06-01, 40 y.o.   MRN: [id number]   Chief Complaint  Patient presents with  . Diabetes    follow up    HPI  Oscar Fernandez is a delightful 40 yo male, originally from [place], here today for a 4 mo follow-up on his chronic medical conditions.   DMII: a1c 8.3 on metformin so added in invokana 100 and a1c decreased to 7.4. Asa qd.  Fasting a.m. cbgs 2-3x/wk run .  120s-130s fasting qam occ up to 140s-150s if he eats fruit the evening prior. No prob with med tolerance, reports excellent med compliance though Invokana is very expensive for him. He receives his flu vaccine at work. Pneumovax was 2016. Saw Groat, optho, on 05/2015. Nml DM foot exam and urine microalb 05/2015. Not on ace/arb due to nml bp and nml urine microalb with poor med compliance.  Feet fine. HPL at goal LDL <100: On pravastatin 40 ldl 78 and non-hdl 89 10/2015  He has had some problems with medication compliance in the past but this may be due to the language/cultural differences.  Past Medical History  Diagnosis Date  . Diabetes mellitus without complication (HCC)   . Hyperlipidemia    No past surgical history on file. Current Outpatient Prescriptions on File Prior to Visit  Medication Sig Dispense Refill  . acetaminophen (TYLENOL) 500 MG tablet Take 1,000 mg by mouth every 6 (six) hours as needed for mild pain. Reported on 10/29/2015    . aspirin EC 81 MG tablet Take 1 tablet (81 mg total) by mouth daily.    . metFORMIN (GLUCOPHAGE) 1000 MG tablet Take 1 tablet (1,000 mg total) by mouth 2 (two) times daily with a meal. 180 tablet 3  . pravastatin (PRAVACHOL) 40 MG tablet Take 1 tablet (40 mg total) by mouth daily. 90 tablet 1   No current facility-administered medications on file prior to visit.   Not on File No family history on file. Social History   Social History  . Marital Status: Married    Spouse Name: N/A  . Number of Children: N/A  . Years of  Education: college   Occupational History  . industrial    Social History Main Topics  . Smoking status: Never Smoker   . Smokeless tobacco: None  . Alcohol Use: No  . Drug Use: No  . Sexual Activity: Yes   Other Topics Concern  . None   Social History Narrative    Review of Systems See HPI    Objective:  BP 105/72 mmHg  Pulse 69  Temp(Src) 97.6 F (36.4 C) (Oral)  Resp 18  Ht  (1.702 m)  Wt 178 lb (80.74 kg)  BMI 27.87 kg/m2  Physical Exam  Constitutional: He is oriented to person, place, and time. He appears well-developed and well-nourished. No distress.  HENT:  Head: Normocephalic and atraumatic.  Eyes: Conjunctivae are normal. Pupils are equal, round, and reactive to light. No scleral icterus.  Neck: Normal range of motion. Neck supple. No thyromegaly present.  Cardiovascular: Normal rate, regular rhythm, normal heart sounds and intact distal pulses.   Pulmonary/Chest: Effort normal and breath sounds normal. No respiratory distress.  Musculoskeletal: He exhibits no edema.  Lymphadenopathy:    He has no cervical adenopathy.  Neurological: He is alert and oriented to person, place, and time.  Skin: Skin is warm and dry. He is not diaphoretic.  Psychiatric: He has a normal  mood and affect. His behavior is normal.      Assessment & Plan:    thyromegaly resolved on exam today Ok to refill metformin x 1 mo with 5 refills whenever needed but will plan to switch to combo pill at next OV around Sept as long as he is tolerating the [place]. Stop Invokana.  1. Type 2 diabetes mellitus with hyperglycemia, without long-term current use of insulin (HCC)   2. Hyperlipidemia LDL goal <100   Cont pravastatin 40 - ok to refill prn.  Orders Placed This Encounter  Procedures  . Comprehensive metabolic panel  . Hemoglobin A1c    Meds ordered this encounter  Medications  . dapagliflozin propanediol (FARXIGA) 5 MG TABS tablet    Sig: Take 5 mg by mouth daily.     Dispense:  30 tablet    Refill:  5    Norberto SorensonEva Vi Whitesel, M.D.  [facility]  [place] [address] [place], [place] [address] [phone] phone [phone] fax  03/24/2016 9:21 PM

## 2016-03-03 NOTE — Patient Instructions (Addendum)
IF you received an x-ray today, you will receive an invoice from [facility]. Please contact [facility] at [phone] with questions or concerns regarding your invoice.   IF you received labwork today, you will receive an invoice from [employer]. Please contact [facility] at [phone] with questions or concerns regarding your invoice.   Our billing staff will not be able to assist you with questions regarding bills from these companies.  You will be contacted with the lab results as soon as they are available. The fastest way to get your results is to activate your My Chart account. Instructions are located on the last page of this paperwork. If you have not heard from [place] regarding the results in 2 weeks, please contact this office.    Diabetes and Standards of Medical Care Diabetes is complicated. You may find that your diabetes team includes a dietitian, nurse, diabetes educator, eye doctor, and more. To help everyone know what is going on and to help you get the care you deserve, the following schedule of care was developed to help keep you on track. Below are the tests, exams, vaccines, medicines, education, and plans you will need. HbA1c test This test shows how well you have controlled your glucose over the past 2-3 months. It is used to see if your diabetes management plan needs to be adjusted.   It is performed at least 2 times a year if you are meeting treatment goals.  It is performed 4 times a year if therapy has changed or if you are not meeting treatment goals. Blood pressure test  This test is performed at every routine medical visit. The goal is less than 140/90 mm Hg for most people, but 130/80 mm Hg in some cases. Ask your health care provider about your goal. Dental exam  Follow up with the dentist regularly. Eye exam  If you are diagnosed with type 1 diabetes as a child, get an exam upon reaching the age of 35 years  or older and having had diabetes for 3-5 years. Yearly eye exams are recommended after that initial eye exam.  If you are diagnosed with type 1 diabetes as an adult, get an exam within 5 years of diagnosis and then yearly.  If you are diagnosed with type 2 diabetes, get an exam as soon as possible after the diagnosis and then yearly. Foot care exam  Visual foot exams are performed at every routine medical visit. The exams check for cuts, injuries, or other problems with the feet.  You should have a complete foot exam performed every year. This exam includes an inspection of the structure and skin of your feet, a check of the pulses in your feet, and a check of the sensation in your feet.  Type 1 diabetes: The first exam is performed 5 years after diagnosis.  Type 2 diabetes: The first exam is performed at the time of diagnosis.  Check your feet nightly for cuts, injuries, or other problems with your feet. Tell your health care provider if anything is not healing. Kidney function test (urine microalbumin)  This test is performed once a year.  Type 1 diabetes: The first test is performed 5 years after diagnosis.  Type 2 diabetes: The first test is performed at the time of diagnosis.  A serum creatinine and estimated glomerular filtration rate (eGFR) test is done once a year to assess the level of chronic kidney disease (CKD), if present. Lipid profile (cholesterol, HDL,  HDL, LDL, triglycerides)  Performed every 5 years for most people.  The goal for LDL is less than 100 mg/dL. If you are at high risk, the goal is less than 70 mg/dL.  The goal for HDL is 40 mg/dL-50 mg/dL for men and 50 mg/dL-60 mg/dL for women. An HDL cholesterol of 60 mg/dL or higher gives some protection against heart disease.  The goal for triglycerides is less than 150 mg/dL. Immunizations  The flu (influenza) vaccine is recommended yearly for every person 6 months of age or older who has diabetes.  The  pneumonia (pneumococcal) vaccine is recommended for every person 2 years of age or older who has diabetes. Adults 65 years of age or older may receive the pneumonia vaccine as a series of two separate shots.  The hepatitis B vaccine is recommended for adults shortly after they have been diagnosed with diabetes.  The Tdap (tetanus, diphtheria, and pertussis) vaccine should be given:  According to normal childhood vaccination schedules, for children.  Every 10 years, for adults who have diabetes. Diabetes self-management education  Education is recommended at diagnosis and ongoing as needed. Treatment plan  Your treatment plan is reviewed at every medical visit.   This information is not intended to replace advice given to you by your health care provider. Make sure you discuss any questions you have with your health care provider.   Document Released: 07/10/2009 Document Revised: 10/03/2014 Document Reviewed: 02/12/2013 Elsevier Interactive Patient Education 2016 Elsevier Inc.  

## 2016-03-04 ENCOUNTER — Encounter: Payer: Self-pay | Admitting: Family Medicine

## 2016-03-04 LAB — COMPREHENSIVE METABOLIC PANEL
ALT: 27 U/L (ref 9–46)
AST: 22 U/L (ref 10–40)
Albumin: 4 g/dL (ref 3.6–5.1)
Alkaline Phosphatase: 52 U/L (ref 40–115)
BUN: 17 mg/dL (ref 7–25)
CO2: 26 mmol/L (ref 20–31)
Calcium: 9.2 mg/dL (ref 8.6–10.3)
Chloride: 101 mmol/L (ref 98–110)
Creat: 0.68 mg/dL (ref 0.60–1.35)
Glucose, Bld: 123 mg/dL — ABNORMAL HIGH (ref 65–99)
Potassium: 4 mmol/L (ref 3.5–5.3)
Sodium: 138 mmol/L (ref 135–146)
Total Bilirubin: 0.8 mg/dL (ref 0.2–1.2)
Total Protein: 6.1 g/dL (ref 6.1–8.1)

## 2016-03-04 LAB — HEMOGLOBIN A1C
Hgb A1c MFr Bld: 7.4 % — ABNORMAL HIGH (ref <5.7)
Mean Plasma Glucose: 166 mg/dL

## 2016-06-29 ENCOUNTER — Other Ambulatory Visit: Payer: Self-pay | Admitting: Family Medicine

## 2016-06-29 ENCOUNTER — Telehealth: Payer: Self-pay

## 2016-06-29 MED ORDER — PRAVASTATIN SODIUM 40 MG PO TABS: 40.0000 mg | ORAL_TABLET | Freq: Every day | ORAL | 3 refills | Status: DC

## 2016-06-29 MED ORDER — DAPAGLIFLOZIN PRO-METFORMIN ER 5-1000 MG PO TB24: 2.0000 | ORAL_TABLET | Freq: Every day | ORAL | 3 refills | Status: DC

## 2016-06-29 NOTE — Progress Notes (Deleted)
   Subjective:    Patient ID: Zorita Pangrimiao Desroches, male    DOB: 11/17/1975, 40 y.o.   MRN: [id number]  HPI  Mr. Rockne Menghinidoyi is a 40 yo male, originally from [place], here for a 4 month follow-up on his chronic medical conditions.  DMII: a1c 8.3 on metformin so added in invokana 100 and a1c decreased to 7.4. Asa qd.  Fasting a.m. cbgs 2-3x/wk run .  120s-130s fasting qam occ up to 140s-150s if he eats fruit the evening prior. No prob with med tolerance, reports excellent med compliance though Invokana is very expensive for him. He receives his flu vaccine at work. Pneumovax was 2016. Saw Groat, optho, on 05/2015. Nml DM foot exam and urine microalb 05/2015. Not on ace/arb due to nml bp and nml urine microalb with poor med compliance.  Feet fine.   HPL at goal LDL <100: On pravastatin 40 ldl 78 and non-hdl 89 10/2015   Review of Systems     Objective:   Physical Exam        Assessment & Plan:  a1c

## 2016-06-29 NOTE — Telephone Encounter (Signed)
Msg is for Oscar Fernandez, Pt is requesting a refill on the medications that are pending including dapagliflozin propanediol (FARXIGA) 5 MG TABS . Pt has a bad debt balance of 1,160.00 and cannot afford to pay that in a visit that he was trying to make.  Please advise pt  [phone]

## 2016-06-29 NOTE — Telephone Encounter (Signed)
I'm so sorry to hear that. Hopefully he can set up a payment plan and then reschedule his appointment.  If he is doing well on the metformin and the farxiga, the plan was to combine these to medicines together to save $$ so I have sent the combo pill called xigduo to his pharmacy.  He should take 2 pills every morning.  The same coupon that he was using for the farxiga should work for the [employer]. If his pharmacy wants a new coupon, we can come by and pick one up. Also refilled his cholesterol medicine pravastatin. He is due to go see his eye dr. - Dr. Dione BoozeGroat last mo.  Make an appt to recheck asap.

## 2016-06-30 ENCOUNTER — Telehealth: Payer: Self-pay | Admitting: Emergency Medicine

## 2016-06-30 ENCOUNTER — Ambulatory Visit: Payer: BLUE CROSS/BLUE SHIELD | Admitting: Family Medicine

## 2016-06-30 NOTE — Telephone Encounter (Signed)
Left message for patient to return call.

## 2016-07-01 NOTE — Telephone Encounter (Signed)
Pt called and left a VM for me earlier and when I returned call, I got his VM again. I went ahead and left him the detailed message from Dr Clelia Croftshaw but asked [facility] to [facility] if [facility] has ?s and we can explain med combination and RFs to him if needed.

## 2016-07-25 ENCOUNTER — Encounter (HOSPITAL_COMMUNITY): Payer: Self-pay | Admitting: *Deleted

## 2016-07-25 ENCOUNTER — Emergency Department (HOSPITAL_COMMUNITY)
Admission: EM | Admit: 2016-07-25 | Discharge: 2016-07-25 | Disposition: A | Discharge status: Home / Self Care | Payer: BLUE CROSS/BLUE SHIELD | Attending: Emergency Medicine | Admitting: Emergency Medicine

## 2016-07-25 DIAGNOSIS — Z7982 Long term (current) use of aspirin: Secondary | ICD-10-CM | POA: Diagnosis not present

## 2016-07-25 DIAGNOSIS — Z7984 Long term (current) use of oral hypoglycemic drugs: Secondary | ICD-10-CM | POA: Diagnosis not present

## 2016-07-25 DIAGNOSIS — R197 Diarrhea, unspecified: Secondary | ICD-10-CM | POA: Diagnosis present

## 2016-07-25 DIAGNOSIS — E119 Type 2 diabetes mellitus without complications: Secondary | ICD-10-CM | POA: Insufficient documentation

## 2016-07-25 LAB — COMPREHENSIVE METABOLIC PANEL
ALBUMIN: 3.9 g/dL (ref 3.5–5.0)
ALK PHOS: 63 U/L (ref 38–126)
ALT: 24 U/L (ref 17–63)
AST: 22 U/L (ref 15–41)
Anion gap: 8 (ref 5–15)
BUN: 18 mg/dL (ref 6–20)
CALCIUM: 9 mg/dL (ref 8.9–10.3)
CHLORIDE: 104 mmol/L (ref 101–111)
CO2: 24 mmol/L (ref 22–32)
CREATININE: 0.93 mg/dL (ref 0.61–1.24)
GFR calc non Af Amer: >60 mL/min (ref >60)
GLUCOSE: 361 mg/dL — AB (ref 65–99)
Potassium: 3.4 mmol/L — ABNORMAL LOW (ref 3.5–5.1)
SODIUM: 136 mmol/L (ref 135–145)
Total Bilirubin: 0.9 mg/dL (ref 0.3–1.2)
Total Protein: 6.2 g/dL — ABNORMAL LOW (ref 6.5–8.1)

## 2016-07-25 LAB — CBC
HCT: 46.3 % (ref 39.0–52.0)
Hemoglobin: 15.9 g/dL (ref 13.0–17.0)
MCH: 29 pg (ref 26.0–34.0)
MCHC: 34.3 g/dL (ref 30.0–36.0)
MCV: 84.5 fL (ref 78.0–100.0)
PLATELETS: 170 10*3/uL (ref 150–400)
RBC: 5.48 MIL/uL (ref 4.22–5.81)
RDW: 12.9 % (ref 11.5–15.5)
WBC: 5.2 10*3/uL (ref 4.0–10.5)

## 2016-07-25 LAB — LIPASE, BLOOD: LIPASE: 28 U/L (ref 11–51)

## 2016-07-25 MED ORDER — LOPERAMIDE HCL 2 MG PO CAPS: 2.0000 mg | ORAL_CAPSULE | Freq: Two times a day (BID) | ORAL | 0 refills | Status: DC | PRN

## 2016-07-25 NOTE — ED Triage Notes (Signed)
Pt states acute onset abdominal cramping and diarrhea at 2 am.  Pt has since had 10 episodes of diarrhea, but presently denies abdominal pain.

## 2016-07-25 NOTE — Discharge Instructions (Signed)
Take the prescribed medication as directed if needed for continue diarrhea. Follow-up with your primary care doctor. Return to the ED for new or worsening symptoms.

## 2016-07-25 NOTE — ED Provider Notes (Signed)
[facility]-EMERGENCY DEPT Provider Note   CSN: [id number] Arrival date & time: 07/25/16  1553     History   Chief Complaint Chief Complaint  Patient presents with  . Diarrhea    HPI Oscar Fernandez is a 40 y.o. male.  The history is provided by the patient and medical records.  Diarrhea   Pertinent negatives include no abdominal pain and no vomiting.    40 year old male with history of diabetes and hyperlipidemia, presenting to the ED for diarrhea. Patient states this began around 2 AM. He estimates 10 episodes since this time. Last loose stool was around 12:30PM (7 hours ago).  States stool has been loose and watery. He denies any melena or hematochezia. He has not had any nausea or vomiting. No abdominal pain. States 3 days ago his daughter was complaining of stomach pain and had some mild diarrhea as well. No fever or chills. No recent travel or antibiotics use. States he has not yet taken his medications today due to his symptoms.  Past Medical History:  Diagnosis Date  . Diabetes mellitus without complication (HCC)   . Hyperlipidemia     Patient Active Problem List   Diagnosis Date Noted  . Type 2 diabetes mellitus (HCC) 04/19/2014  . Hyperlipidemia LDL goal <100 04/19/2014    History reviewed. No pertinent surgical history.     Home Medications    Prior to Admission medications   Medication Sig Start Date End Date Taking? Authorizing Provider  acetaminophen (TYLENOL) 500 MG tablet Take 1,000 mg by mouth every 6 (six) hours as needed for mild pain. Reported on 10/29/2015    Historical Provider, MD  aspirin EC 81 MG tablet Take 1 tablet (81 mg total) by mouth daily. 04/18/14   Sherren MochaEva N Shaw, MD  Dapagliflozin-Metformin HCl ER (XIGDUO XR) 01-999 MG TB24 Take 2 tablets by mouth daily. 06/29/16   Sherren MochaEva N Shaw, MD  pravastatin (PRAVACHOL) 40 MG tablet Take 1 tablet (40 mg total) by mouth daily. 06/29/16   Sherren MochaEva N Shaw, MD    Family History No family history on file.  Social  History Social History  Substance Use Topics  . Smoking status: Never Smoker  . Smokeless tobacco: Never Used  . Alcohol use No     Allergies   Review of patient's allergies indicates no known allergies.   Review of Systems Review of Systems  Gastrointestinal: Positive for diarrhea. Negative for abdominal pain, nausea and vomiting.  All other systems reviewed and are negative.    Physical Exam Updated Vital Signs BP 127/73 (BP Location: Right Arm)   Pulse 91   Temp 98.8 F (37.1 C) (Oral)   Resp 16   SpO2 100%   Physical Exam  Constitutional: He is oriented to person, place, and time. He appears well-developed and well-nourished.  HENT:  Head: Normocephalic and atraumatic.  Mouth/Throat: Oropharynx is clear and moist.  Eyes: Conjunctivae and EOM are normal. Pupils are equal, round, and reactive to light.  Neck: Normal range of motion.  Cardiovascular: Normal rate, regular rhythm and normal heart sounds.   Pulmonary/Chest: Effort normal and breath sounds normal.  Abdominal: Soft. Bowel sounds are normal. There is no tenderness. There is no rigidity and no guarding.  Abdomen soft, nondistended, normal bowel sounds, no tenderness or peritonitis  Musculoskeletal: Normal range of motion.  Neurological: He is alert and oriented to person, place, and time.  Skin: Skin is warm and dry.  Psychiatric: He has a normal mood and affect.  Nursing  note and vitals reviewed.   ED Treatments / Results  Labs (all labs ordered are listed, but only abnormal results are displayed) Labs Reviewed  COMPREHENSIVE METABOLIC PANEL - Abnormal; Notable for the following:       Result Value   Potassium 3.4 (*)    Glucose, Bld 361 (*)    Total Protein 6.2 (*)    All other components within normal limits  LIPASE, BLOOD  CBC    EKG  EKG Interpretation None       Radiology No results found.  Procedures Procedures (including critical care time)  Medications Ordered in  ED Medications - No data to display   Initial Impression / Assessment and Plan / ED Course  I have reviewed the triage vital signs and the nursing notes.  Pertinent labs & imaging results that were available during my care of the patient were reviewed by me and considered in my medical decision making (see chart for details).  Clinical Course   40 year old male here with diarrhea, onset 2 AM. Last loose stool around 7 hours ago. Patient is afebrile and nontoxic. His abdomen is soft and benign. He has normal bowel sounds. No nausea or vomiting. Screening lab work is reassuring. Does have some hyperglycemia, likely due to not taking his metformin this morning. No evidence of DKA. Suspect viral process as source of his diarrhea.  Lower suspicion for acute or surgical abdomen including small bowel obstruction, cholecystitis, appendicitis, perforation, diverticulitis.  Doubt c.diff colitis as well.  Recommended supportive care at home, may take Imodium if needed.  FU with PCP.  Discussed plan with patient, he acknowledged understanding and agreed with plan of care.  Return precautions given for new or worsening symptoms.  Final Clinical Impressions(s) / ED Diagnoses   Final diagnoses:  Diarrhea, unspecified type    New Prescriptions Discharge Medication List as of 07/25/2016  7:59 PM    START taking these medications   Details  loperamide (IMODIUM) 2 MG capsule Take 1 capsule (2 mg total) by mouth 2 (two) times daily as needed for diarrhea or loose stools., Starting Mon 07/25/2016, Print         Garlon HatchetLisa M Jesslynn Kruck, PA-C 07/25/16 2012    Doug SouSam Jacubowitz, MD 07/26/16 [id number]

## 2016-08-26 LAB — HM DIABETES EYE EXAM

## 2016-12-26 ENCOUNTER — Other Ambulatory Visit: Payer: Self-pay | Admitting: Family Medicine

## 2017-04-29 ENCOUNTER — Other Ambulatory Visit: Payer: Self-pay | Admitting: Family Medicine

## 2017-05-01 NOTE — Telephone Encounter (Signed)
Please advise.....confusion on how many tabs they get and also hasn't been seen in over a year

## 2017-07-10 ENCOUNTER — Other Ambulatory Visit: Payer: Self-pay | Admitting: Family Medicine

## 2017-07-12 ENCOUNTER — Ambulatory Visit (INDEPENDENT_AMBULATORY_CARE_PROVIDER_SITE_OTHER): Payer: BLUE CROSS/BLUE SHIELD | Admitting: Physician Assistant

## 2017-07-12 ENCOUNTER — Encounter: Payer: Self-pay | Admitting: Physician Assistant

## 2017-07-12 VITALS — BP 108/72 | HR 75 | Temp 98.2°F | Resp 16 | Ht 67.0 in | Wt 181.4 lb

## 2017-07-12 DIAGNOSIS — Z23 Encounter for immunization: Secondary | ICD-10-CM

## 2017-07-12 DIAGNOSIS — E1165 Type 2 diabetes mellitus with hyperglycemia: Secondary | ICD-10-CM

## 2017-07-12 MED ORDER — DAPAGLIFLOZIN PRO-METFORMIN ER 5-1000 MG PO TB24: 2.0000 | ORAL_TABLET | Freq: Every day | ORAL | 3 refills | Status: DC

## 2017-07-12 MED ORDER — DAPAGLIFLOZIN PRO-METFORMIN ER 5-1000 MG PO TB24: 2.0000 | ORAL_TABLET | Freq: Every day | ORAL | 1 refills | Status: DC

## 2017-07-12 MED ORDER — PRAVASTATIN SODIUM 40 MG PO TABS: 40.0000 mg | ORAL_TABLET | Freq: Every day | ORAL | 3 refills | Status: DC

## 2017-07-12 NOTE — Progress Notes (Signed)
PRIMARY CARE AT [facility] [address] [place] [address] [phone]  Date:  07/12/2017   Name:  Oscar Fernandez   DOB:  08-30-1976   MRN:  [id number]  PCP:  Shawnee Knapp, MD    History of Present Illness:  Oscar Fernandez is a 41 y.o. male patient who presents to PCP with  Chief Complaint  Patient presents with  . Medication Refill    metformin '100mg'$      His blood sugar was 181 today because he has been without the medication for 9 days.   He generally is taking the xigduo compliantly.  He reported that he inquired about the metformin while he was traveling but is not taking this.  When he does have his medication it is around 131-135. No urinary frequency, nausea, diarrhea.  Denies any blurriness.  No numbness or tingling. He is eating african foods vegetable foods.  Rice is eaten.  Sodas are minimal, and sugar free.   Exercise: he is currently not exercising.  Plans to return to the gym.  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    Patient Active Problem List   Diagnosis Date Noted  . Type 2 diabetes mellitus ([place]) 04/19/2014  . Hyperlipidemia LDL goal <100 04/19/2014    Past Medical History:  Diagnosis Date  . Diabetes mellitus without complication ([place])   . Hyperlipidemia     History reviewed. No pertinent surgical history.  Social History  Substance Use Topics  . Smoking status: Never Smoker  . Smokeless tobacco: Never Used  . Alcohol use No    History reviewed. No pertinent family history.  No Known Allergies  Medication list has been reviewed and updated.  Current Outpatient Prescriptions on File Prior to Visit  Medication Sig Dispense Refill  . aspirin EC 81 MG tablet Take 1 tablet (81 mg total) by mouth daily.    . metFORMIN (GLUCOPHAGE) 1000 MG tablet TAKE ONE TABLET BY MOUTH TWICE DAILY WITH  A  MEAL 60 tablet 0  . pravastatin (PRAVACHOL) 40 MG tablet Take 1 tablet (40 mg total) by mouth daily. 90  tablet 3  . acetaminophen (TYLENOL) 500 MG tablet Take 1,000 mg by mouth every 6 (six) hours as needed for mild pain. Reported on 10/29/2015    . Dapagliflozin-Metformin HCl ER (XIGDUO XR) 01-999 MG TB24 Take 2 tablets by mouth daily. (Patient not taking: Reported on 07/12/2017) 90 tablet 3  . loperamide (IMODIUM) 2 MG capsule Take 1 capsule (2 mg total) by mouth 2 (two) times daily as needed for diarrhea or loose stools. (Patient not taking: Reported on 07/12/2017) 12 capsule 0   No current facility-administered medications on file prior to visit.     ROS ROS otherwise unremarkable unless listed above.  Physical Examination: There were no vitals taken for this visit. Ideal Body Weight:    Physical Exam  Constitutional: He is oriented to person, place, and time. He appears well-developed and well-nourished. No distress.  HENT:  Head: Normocephalic and atraumatic.  Eyes: Pupils are equal, round, and reactive to light. Conjunctivae and EOM are normal.  Cardiovascular: Normal rate.   Pulmonary/Chest: Effort normal. No respiratory distress.  Feet:  Right Foot:  Protective Sensation: 6 sites tested. 6 sites sensed.  Skin Integrity: Positive for dry skin (mild). Negative for skin breakdown or warmth.  Left Foot:  Protective Sensation: 6 sites tested. 6 sites sensed.  Skin Integrity: Positive for dry  skin. Negative for skin breakdown or warmth.  Neurological: He is alert and oriented to person, place, and time.  Skin: Skin is warm and dry. He is not diaphoretic.  Psychiatric: He has a normal mood and affect. His behavior is normal.     Assessment and Plan: Oscar Fernandez is a 41 y.o. male who is here today for cc of  Chief Complaint  Patient presents with  . Medication Refill    metformin '100mg'$   --diabetic diet given --rtc in 3-6 months pending lab results. Pharmacy contacted for cancelled meds Type 2 diabetes mellitus with hyperglycemia, without long-term current use of insulin  (HCC) - Plan: Hemoglobin A1c, CMP14+EGFR, Microalbumin, urine, HM Diabetes Foot Exam, pravastatin (PRAVACHOL) 40 MG tablet, Dapagliflozin-Metformin HCl ER (XIGDUO XR) 01-999 MG TB24, DISCONTINUED: Dapagliflozin-Metformin HCl ER (XIGDUO XR) 01-999 MG TB24, DISCONTINUED: Dapagliflozin-Metformin HCl ER (XIGDUO XR) 01-999 MG TB24  Need for prophylactic vaccination and inoculation against influenza - Plan: Flu Vaccine QUAD 6+ mos PF IM (Fluarix Quad PF)  Ivar Drape, PA-C [facility] 10/17/20185:41 PM

## 2017-07-12 NOTE — Patient Instructions (Signed)
Diabetes Mellitus and Food It is important for you to manage your blood sugar (glucose) level. Your blood glucose level can be greatly affected by what you eat. Eating healthier foods in the appropriate amounts throughout the day at about the same time each day will help you control your blood glucose level. It can also help slow or prevent worsening of your diabetes mellitus. Healthy eating may even help you improve the level of your blood pressure and reach or maintain a healthy weight. General recommendations for healthful eating and cooking habits include:  Eating meals and snacks regularly. Avoid going long periods of time without eating to lose weight.  Eating a diet that consists mainly of plant-based foods, such as fruits, vegetables, nuts, legumes, and whole grains.  Using low-heat cooking methods, such as baking, instead of high-heat cooking methods, such as deep frying.  Work with your dietitian to make sure you understand how to use the Nutrition Facts information on food labels. How can food affect me? Carbohydrates Carbohydrates affect your blood glucose level more than any other type of food. Your dietitian will help you determine how many carbohydrates to eat at each meal and teach you how to count carbohydrates. Counting carbohydrates is important to keep your blood glucose at a healthy level, especially if you are using insulin or taking certain medicines for diabetes mellitus. Alcohol Alcohol can cause sudden decreases in blood glucose (hypoglycemia), especially if you use insulin or take certain medicines for diabetes mellitus. Hypoglycemia can be a life-threatening condition. Symptoms of hypoglycemia (sleepiness, dizziness, and disorientation) are similar to symptoms of having too much alcohol. If your health care provider has given you approval to drink alcohol, do so in moderation and use the following guidelines:  Women should not have more than one drink per day, and men  should not have more than two drinks per day. One drink is equal to: ? 12 oz of beer. ? 5 oz of wine. ? 1 oz of hard liquor.  Do not drink on an empty stomach.  Keep yourself hydrated. Have water, diet soda, or unsweetened iced tea.  Regular soda, juice, and other mixers might contain a lot of carbohydrates and should be counted.  What foods are not recommended? As you make food choices, it is important to remember that all foods are not the same. Some foods have fewer nutrients per serving than other foods, even though they might have the same number of calories or carbohydrates. It is difficult to get your body what it needs when you eat foods with fewer nutrients. Examples of foods that you should avoid that are high in calories and carbohydrates but low in nutrients include:  Trans fats (most processed foods list trans fats on the Nutrition Facts label).  Regular soda.  Juice.  Candy.  Sweets, such as cake, pie, doughnuts, and cookies.  Fried foods.  What foods can I eat? Eat nutrient-rich foods, which will nourish your body and keep you healthy. The food you should eat also will depend on several factors, including:  The calories you need.  The medicines you take.  Your weight.  Your blood glucose level.  Your blood pressure level.  Your cholesterol level.  You should eat a variety of foods, including:  Protein. ? Lean cuts of meat. ? Proteins low in saturated fats, such as fish, egg whites, and beans. Avoid processed meats.  Fruits and vegetables. ? Fruits and vegetables that may help control blood glucose levels, such as apples,   mangoes, and yams.  Dairy products. ? Choose fat-free or low-fat dairy products, such as milk, yogurt, and cheese.  Grains, bread, pasta, and rice. ? Choose whole grain products, such as multigrain bread, whole oats, and brown rice. These foods may help control blood pressure.  Fats. ? Foods containing healthful fats, such as  nuts, avocado, olive oil, canola oil, and fish.  Does everyone with diabetes mellitus have the same meal plan? Because every person with diabetes mellitus is different, there is not one meal plan that works for everyone. It is very important that you meet with a dietitian who will help you create a meal plan that is just right for you. This information is not intended to replace advice given to you by your health care provider. Make sure you discuss any questions you have with your health care provider. Document Released: 06/09/2005 Document Revised: 02/18/2016 Document Reviewed: 08/09/2013 Elsevier Interactive Patient Education  2017 Elsevier Inc.  

## 2017-07-13 LAB — CMP14+EGFR
A/G RATIO: 1.7 (ref 1.2–2.2)
ALT: 11 IU/L (ref 0–44)
AST: 13 IU/L (ref 0–40)
Albumin: 4.3 g/dL (ref 3.5–5.5)
Alkaline Phosphatase: 75 IU/L (ref 39–117)
BUN/Creatinine Ratio: 14 (ref 9–20)
BUN: 15 mg/dL (ref 6–24)
Bilirubin Total: <0.2 mg/dL (ref 0.0–1.2)
CALCIUM: 9.8 mg/dL (ref 8.7–10.2)
CO2: 24 mmol/L (ref 20–29)
CREATININE: 1.08 mg/dL (ref 0.76–1.27)
Chloride: 100 mmol/L (ref 96–106)
GFR, EST AFRICAN AMERICAN: 98 mL/min/{1.73_m2} (ref >59)
GFR, EST NON AFRICAN AMERICAN: 85 mL/min/{1.73_m2} (ref >59)
Globulin, Total: 2.6 g/dL (ref 1.5–4.5)
Glucose: 310 mg/dL — ABNORMAL HIGH (ref 65–99)
POTASSIUM: 4.3 mmol/L (ref 3.5–5.2)
Sodium: 138 mmol/L (ref 134–144)
TOTAL PROTEIN: 6.9 g/dL (ref 6.0–8.5)

## 2017-07-13 LAB — MICROALBUMIN, URINE: Microalbumin, Urine: <3 ug/mL

## 2017-07-13 LAB — HEMOGLOBIN A1C
ESTIMATED AVERAGE GLUCOSE: 180 mg/dL
HEMOGLOBIN A1C: 7.9 % — AB (ref 4.8–5.6)

## 2017-11-01 ENCOUNTER — Other Ambulatory Visit: Payer: Self-pay

## 2017-11-01 ENCOUNTER — Ambulatory Visit (INDEPENDENT_AMBULATORY_CARE_PROVIDER_SITE_OTHER): Payer: BLUE CROSS/BLUE SHIELD | Admitting: Physician Assistant

## 2017-11-01 ENCOUNTER — Encounter: Payer: Self-pay | Admitting: Physician Assistant

## 2017-11-01 VITALS — BP 129/87 | HR 77 | Temp 97.5°F | Resp 18 | Ht 65.75 in | Wt 186.2 lb

## 2017-11-01 DIAGNOSIS — Z23 Encounter for immunization: Secondary | ICD-10-CM | POA: Diagnosis not present

## 2017-11-01 DIAGNOSIS — E1165 Type 2 diabetes mellitus with hyperglycemia: Secondary | ICD-10-CM | POA: Diagnosis not present

## 2017-11-01 LAB — HEMOGLOBIN A1C
Est. average glucose Bld gHb Est-mCnc: 189 mg/dL
HEMOGLOBIN A1C: 8.2 % — AB (ref 4.8–5.6)

## 2017-11-01 NOTE — Progress Notes (Signed)
PRIMARY CARE AT [facility] [address], [place] [address] [phone]  Date:  11/01/2017   Name:  Oscar Fernandez   DOB:  06-02-1976   MRN:  [id number]  PCP:  Shawnee Knapp, MD    History of Present Illness:  Oscar Fernandez is a 42 y.o. male patient who presents to PCP with  Chief Complaint  Patient presents with  . Immunizations    Tdap and MMR and paperwork for school     Patient is requesting tdap and mmr at this time.   He is unsure whether these have been performed or not. He brings in paperwork for completion. He is attending school for public health. He is also here with need of diabetes recheck after 3 months. His a1c is elevated at 7.9.  He reports that he is taking medication compliantly.  His blood sugar has gone down though he can not give me specific numbers.    Patient Active Problem List   Diagnosis Date Noted  . Type 2 diabetes mellitus ([place]) 04/19/2014  . Hyperlipidemia LDL goal <100 04/19/2014    Past Medical History:  Diagnosis Date  . Diabetes mellitus without complication ([place])   . Hyperlipidemia     History reviewed. No pertinent surgical history.  Social History   Tobacco Use  . Smoking status: Never Smoker  . Smokeless tobacco: Never Used  Substance Use Topics  . Alcohol use: No  . Drug use: No    History reviewed. No pertinent family history.  No Known Allergies  Medication list has been reviewed and updated.  Current Outpatient Medications on File Prior to Visit  Medication Sig Dispense Refill  . Dapagliflozin-Metformin HCl ER (XIGDUO XR) 01-999 MG TB24 Take 2 tablets by mouth daily. 180 tablet 1  . pravastatin (PRAVACHOL) 40 MG tablet Take 1 tablet (40 mg total) by mouth daily. 90 tablet 3  . acetaminophen (TYLENOL) 500 MG tablet Take 1,000 mg by mouth every 6 (six) hours as needed for mild pain. Reported on 10/29/2015    . aspirin EC 81 MG tablet Take 1 tablet (81 mg total) by mouth daily. (Patient not taking: Reported on 11/01/2017)     . loperamide (IMODIUM) 2 MG capsule Take 1 capsule (2 mg total) by mouth 2 (two) times daily as needed for diarrhea or loose stools. (Patient not taking: Reported on 07/12/2017) 12 capsule 0   No current facility-administered medications on file prior to visit.     ROS ROS otherwise unremarkable unless listed above.  Physical Examination: BP 129/87 (BP Location: Right Arm, Patient Position: Sitting, Cuff Size: Normal)   Pulse 77   Temp (!) 97.5 F (36.4 C) (Oral)   Resp 18   Ht 5' 5.75" (1.67 m)   Wt 186 lb 3.2 oz (84.5 kg)   SpO2 98%   BMI 30.28 kg/m  Ideal Body Weight: Weight in (lb) to have BMI = 25: 153.4  Physical Exam  Constitutional: He is oriented to person, place, and time. He appears well-developed and well-nourished. No distress.  HENT:  Head: Normocephalic and atraumatic.  Eyes: Conjunctivae and EOM are normal. Pupils are equal, round, and reactive to light.  Cardiovascular: Normal rate and regular rhythm. Exam reveals no friction rub.  No murmur heard. Pulmonary/Chest: Effort normal. No respiratory distress.  Neurological: He is alert and oriented to person, place, and time.  Skin: Skin is warm and dry. He is not diaphoretic.  Psychiatric: He has a normal mood and affect. His behavior is  normal.     Assessment and Plan: Oscar Fernandez is a 42 y.o. male who is here today for cc of  Chief Complaint  Patient presents with  . Immunizations    Tdap and MMR and paperwork for school   tdap and mmr placed today.  Form completed.  Advised to rt for follow up immunizations. Type 2 diabetes mellitus with hyperglycemia, without long-term current use of insulin (HCC) - Plan: Hemoglobin A1c  Need for MMR vaccine - Plan: MMR vaccine subcutaneous, MMR vaccine subcutaneous  Need for Td vaccine - Plan: Td vaccine greater than or equal to 7yo preservative free IM, Td vaccine greater than or equal to 7yo preservative free IM  Ivar Drape, PA-C [facility] 2/17/20198:47 PM

## 2017-11-01 NOTE — Patient Instructions (Addendum)
Return in 6 months for a tetanus Return in one month for the mmr.  Diabetes Mellitus and Nutrition When you have diabetes (diabetes mellitus), it is very important to have healthy eating habits because your blood sugar (glucose) levels are greatly affected by what you eat and drink. Eating healthy foods in the appropriate amounts, at about the same times every day, can help you:  Control your blood glucose.  Lower your risk of heart disease.  Improve your blood pressure.  Reach or maintain a healthy weight.  Every person with diabetes is different, and each person has different needs for a meal plan. Your health care provider may recommend that you work with a diet and nutrition specialist (dietitian) to make a meal plan that is best for you. Your meal plan may vary depending on factors such as:  The calories you need.  The medicines you take.  Your weight.  Your blood glucose, blood pressure, and cholesterol levels.  Your activity level.  Other health conditions you have, such as heart or kidney disease.  How do carbohydrates affect me? Carbohydrates affect your blood glucose level more than any other type of food. Eating carbohydrates naturally increases the amount of glucose in your blood. Carbohydrate counting is a method for keeping track of how many carbohydrates you eat. Counting carbohydrates is important to keep your blood glucose at a healthy level, especially if you use insulin or take certain oral diabetes medicines. It is important to know how many carbohydrates you can safely have in each meal. This is different for every person. Your dietitian can help you calculate how many carbohydrates you should have at each meal and for snack. Foods that contain carbohydrates include:  Bread, cereal, rice, pasta, and crackers.  Potatoes and corn.  Peas, beans, and lentils.  Milk and yogurt.  Fruit and juice.  Desserts, such as cakes, cookies, ice cream, and  candy.  How does alcohol affect me? Alcohol can cause a sudden decrease in blood glucose (hypoglycemia), especially if you use insulin or take certain oral diabetes medicines. Hypoglycemia can be a life-threatening condition. Symptoms of hypoglycemia (sleepiness, dizziness, and confusion) are similar to symptoms of having too much alcohol. If your health care provider says that alcohol is safe for you, follow these guidelines:  Limit alcohol intake to no more than 1 drink per day for nonpregnant women and 2 drinks per day for men. One drink equals 12 oz of beer, 5 oz of wine, or 1 oz of hard liquor.  Do not drink on an empty stomach.  Keep yourself hydrated with water, diet soda, or unsweetened iced tea.  Keep in mind that regular soda, juice, and other mixers may contain a lot of sugar and must be counted as carbohydrates.  What are tips for following this plan? Reading food labels  Start by checking the serving size on the label. The amount of calories, carbohydrates, fats, and other nutrients listed on the label are based on one serving of the food. Many foods contain more than one serving per package.  Check the total grams (g) of carbohydrates in one serving. You can calculate the number of servings of carbohydrates in one serving by dividing the total carbohydrates by 15. For example, if a food has 30 g of total carbohydrates, it would be equal to 2 servings of carbohydrates.  Check the number of grams (g) of saturated and trans fats in one serving. Choose foods that have low or no amount of these  fats.  Check the number of milligrams (mg) of sodium in one serving. Most people should limit total sodium intake to less than 2,300 mg per day.  Always check the nutrition information of foods labeled as "low-fat" or "nonfat". These foods may be higher in added sugar or refined carbohydrates and should be avoided.  Talk to your dietitian to identify your daily goals for nutrients listed  on the label. Shopping  Avoid buying canned, premade, or processed foods. These foods tend to be high in fat, sodium, and added sugar.  Shop around the outside edge of the grocery store. This includes fresh fruits and vegetables, bulk grains, fresh meats, and fresh dairy. Cooking  Use low-heat cooking methods, such as baking, instead of high-heat cooking methods like deep frying.  Cook using healthy oils, such as olive, canola, or sunflower oil.  Avoid cooking with butter, cream, or high-fat meats. Meal planning  Eat meals and snacks regularly, preferably at the same times every day. Avoid going long periods of time without eating.  Eat foods high in fiber, such as fresh fruits, vegetables, beans, and whole grains. Talk to your dietitian about how many servings of carbohydrates you can eat at each meal.  Eat 4-6 ounces of lean protein each day, such as lean meat, chicken, fish, eggs, or tofu. 1 ounce is equal to 1 ounce of meat, chicken, or fish, 1 egg, or 1/4 cup of tofu.  Eat some foods each day that contain healthy fats, such as avocado, nuts, seeds, and fish. Lifestyle   Check your blood glucose regularly.  Exercise at least 30 minutes 5 or more days each week, or as told by your health care provider.  Take medicines as told by your health care provider.  Do not use any products that contain nicotine or tobacco, such as cigarettes and e-cigarettes. If you need help quitting, ask your health care provider.  Work with a Social worker or diabetes educator to identify strategies to manage stress and any emotional and social challenges. What are some questions to ask my health care provider?  Do I need to meet with a diabetes educator?  Do I need to meet with a dietitian?  What number can I call if I have questions?  When are the best times to check my blood glucose? Where to find more information:  American Diabetes Association: diabetes.org/food-and-fitness/food  Academy  of Nutrition and Dietetics: PokerClues.dk  [employer] of Diabetes and Digestive and Kidney Diseases (NIH): ContactWire.be Summary  A healthy meal plan will help you control your blood glucose and maintain a healthy lifestyle.  Working with a diet and nutrition specialist (dietitian) can help you make a meal plan that is best for you.  Keep in mind that carbohydrates and alcohol have immediate effects on your blood glucose levels. It is important to count carbohydrates and to use alcohol carefully. This information is not intended to replace advice given to you by your health care provider. Make sure you discuss any questions you have with your health care provider. Document Released: 06/09/2005 Document Revised: 10/17/2016 Document Reviewed: 10/17/2016 Elsevier Interactive Patient Education  2018 [employer].   IF you received an x-ray today, you will receive an invoice from [facility]. Please contact [facility] at [phone] with questions or concerns regarding your invoice.   IF you received labwork today, you will receive an invoice from [facility]. Please contact [facility] at [phone] with questions or concerns regarding your invoice.   Our billing staff will not  be able to assist you with questions regarding bills from these companies.  You will be contacted with the lab results as soon as they are available. The fastest way to get your results is to activate your My Chart account. Instructions are located on the last page of this paperwork. If you have not heard from [place] regarding the results in 2 weeks, please contact this office.

## 2017-11-12 ENCOUNTER — Encounter: Payer: Self-pay | Admitting: Physician Assistant

## 2017-11-16 ENCOUNTER — Telehealth: Payer: Self-pay | Admitting: *Deleted

## 2017-11-16 DIAGNOSIS — E1165 Type 2 diabetes mellitus with hyperglycemia: Secondary | ICD-10-CM

## 2017-11-16 NOTE — Telephone Encounter (Signed)
Oscar CornfieldStephanie, I spoke with patient he is taking it 2 times daily.   I informed him to continue taking it until he hears from us with any changes.   Patient voiced understanding

## 2017-11-17 MED ORDER — SITAGLIPTIN PHOSPHATE 50 MG PO TABS: 50.0000 mg | ORAL_TABLET | Freq: Every day | ORAL | 0 refills | Status: DC

## 2017-11-17 NOTE — Addendum Note (Signed)
Addended by: Trena PlattENGLISH, Jameah Rouser D on: 11/17/2017 09:58 AM   Modules accepted: Orders

## 2017-11-17 NOTE — Telephone Encounter (Signed)
He needs to check his blood sugar in the morning.   Please advise that I am adding [place].  Take daily.  Return in 2 weeks for recheck of blood sugar in office.  Type 2 diabetes mellitus with hyperglycemia, without long-term current use of insulin (HCC) - Plan: sitaGLIPtin (JANUVIA) 50 MG tablet  Trena PlattStephanie Nial Hawe, PA-C [facility] 2/22/20199:58 AM

## 2017-11-21 NOTE — Telephone Encounter (Signed)
Patient advised per Stephanies note to check blood sugar in morning and Januvia was added to come back in 2 weeks for a recheck.       Detailed message left

## 2017-12-21 ENCOUNTER — Other Ambulatory Visit: Payer: Self-pay | Admitting: Physician Assistant

## 2017-12-21 DIAGNOSIS — E1165 Type 2 diabetes mellitus with hyperglycemia: Secondary | ICD-10-CM

## 2018-01-03 ENCOUNTER — Encounter: Payer: Self-pay | Admitting: Physician Assistant

## 2018-06-17 ENCOUNTER — Other Ambulatory Visit: Payer: Self-pay | Admitting: Family Medicine

## 2018-06-20 ENCOUNTER — Ambulatory Visit (INDEPENDENT_AMBULATORY_CARE_PROVIDER_SITE_OTHER): Payer: BLUE CROSS/BLUE SHIELD | Admitting: Emergency Medicine

## 2018-06-20 ENCOUNTER — Encounter: Payer: Self-pay | Admitting: Emergency Medicine

## 2018-06-20 ENCOUNTER — Other Ambulatory Visit: Payer: Self-pay

## 2018-06-20 VITALS — BP 116/70 | HR 74 | Temp 97.8°F | Resp 16 | Ht 66.0 in | Wt 183.0 lb

## 2018-06-20 DIAGNOSIS — E1165 Type 2 diabetes mellitus with hyperglycemia: Secondary | ICD-10-CM | POA: Diagnosis not present

## 2018-06-20 LAB — POCT GLYCOSYLATED HEMOGLOBIN (HGB A1C): HEMOGLOBIN A1C: 7.8 % — AB (ref 4.0–5.6)

## 2018-06-20 LAB — GLUCOSE, POCT (MANUAL RESULT ENTRY): POC GLUCOSE: 134 mg/dL — AB (ref 70–99)

## 2018-06-20 MED ORDER — DAPAGLIFLOZIN PRO-METFORMIN ER 5-1000 MG PO TB24: 2.0000 | ORAL_TABLET | Freq: Every day | ORAL | 3 refills | Status: DC

## 2018-06-20 MED ORDER — PRAVASTATIN SODIUM 40 MG PO TABS: 40.0000 mg | ORAL_TABLET | Freq: Every day | ORAL | 3 refills | Status: DC

## 2018-06-20 NOTE — Assessment & Plan Note (Signed)
Hemoglobin A1c less than before at 7.8.  Patient admits to missing several doses of medication the past several weeks.  We will continue present meds.  No change at this time.  Follow-up in 3 months.

## 2018-06-20 NOTE — Patient Instructions (Addendum)
   If you have lab work done today you will be contacted with your lab results within the next 2 weeks.  If you have not heard from us then please contact us. The fastest way to get your results is to register for My Chart.   IF you received an x-ray today, you will receive an invoice from [facility]. Please contact [facility] at [phone] with questions or concerns regarding your invoice.   IF you received labwork today, you will receive an invoice from [facility]. Please contact [facility] at [phone] with questions or concerns regarding your invoice.   Our billing staff will not be able to assist you with questions regarding bills from these companies.  You will be contacted with the lab results as soon as they are available. The fastest way to get your results is to activate your My Chart account. Instructions are located on the last page of this paperwork. If you have not heard from us regarding the results in 2 weeks, please contact this office.     Diabetes Mellitus and Nutrition When you have diabetes (diabetes mellitus), it is very important to have healthy eating habits because your blood sugar (glucose) levels are greatly affected by what you eat and drink. Eating healthy foods in the appropriate amounts, at about the same times every day, can help you:  Control your blood glucose.  Lower your risk of heart disease.  Improve your blood pressure.  Reach or maintain a healthy weight.  Every person with diabetes is different, and each person has different needs for a meal plan. Your health care provider may recommend that you work with a diet and nutrition specialist (dietitian) to make a meal plan that is best for you. Your meal plan may vary depending on factors such as:  The calories you need.  The medicines you take.  Your weight.  Your blood glucose, blood pressure, and cholesterol levels.  Your activity level.  Other health conditions you  have, such as heart or kidney disease.  How do carbohydrates affect me? Carbohydrates affect your blood glucose level more than any other type of food. Eating carbohydrates naturally increases the amount of glucose in your blood. Carbohydrate counting is a method for keeping track of how many carbohydrates you eat. Counting carbohydrates is important to keep your blood glucose at a healthy level, especially if you use insulin or take certain oral diabetes medicines. It is important to know how many carbohydrates you can safely have in each meal. This is different for every person. Your dietitian can help you calculate how many carbohydrates you should have at each meal and for snack. Foods that contain carbohydrates include:  Bread, cereal, rice, pasta, and crackers.  Potatoes and corn.  Peas, beans, and lentils.  Milk and yogurt.  Fruit and juice.  Desserts, such as cakes, cookies, ice cream, and candy.  How does alcohol affect me? Alcohol can cause a sudden decrease in blood glucose (hypoglycemia), especially if you use insulin or take certain oral diabetes medicines. Hypoglycemia can be a life-threatening condition. Symptoms of hypoglycemia (sleepiness, dizziness, and confusion) are similar to symptoms of having too much alcohol. If your health care provider says that alcohol is safe for you, follow these guidelines:  Limit alcohol intake to no more than 1 drink per day for nonpregnant women and 2 drinks per day for men. One drink equals 12 oz of beer, 5 oz of wine, or 1 oz of hard liquor.  Do   not drink on an empty stomach.  Keep yourself hydrated with water, diet soda, or unsweetened iced tea.  Keep in mind that regular soda, juice, and other mixers may contain a lot of sugar and must be counted as carbohydrates.  What are tips for following this plan? Reading food labels  Start by checking the serving size on the label. The amount of calories, carbohydrates, fats, and other  nutrients listed on the label are based on one serving of the food. Many foods contain more than one serving per package.  Check the total grams (g) of carbohydrates in one serving. You can calculate the number of servings of carbohydrates in one serving by dividing the total carbohydrates by 15. For example, if a food has 30 g of total carbohydrates, it would be equal to 2 servings of carbohydrates.  Check the number of grams (g) of saturated and trans fats in one serving. Choose foods that have low or no amount of these fats.  Check the number of milligrams (mg) of sodium in one serving. Most people should limit total sodium intake to less than 2,300 mg per day.  Always check the nutrition information of foods labeled as "low-fat" or "nonfat". These foods may be higher in added sugar or refined carbohydrates and should be avoided.  Talk to your dietitian to identify your daily goals for nutrients listed on the label. Shopping  Avoid buying canned, premade, or processed foods. These foods tend to be high in fat, sodium, and added sugar.  Shop around the outside edge of the grocery store. This includes fresh fruits and vegetables, bulk grains, fresh meats, and fresh dairy. Cooking  Use low-heat cooking methods, such as baking, instead of high-heat cooking methods like deep frying.  Cook using healthy oils, such as olive, canola, or sunflower oil.  Avoid cooking with butter, cream, or high-fat meats. Meal planning  Eat meals and snacks regularly, preferably at the same times every day. Avoid going long periods of time without eating.  Eat foods high in fiber, such as fresh fruits, vegetables, beans, and whole grains. Talk to your dietitian about how many servings of carbohydrates you can eat at each meal.  Eat 4-6 ounces of lean protein each day, such as lean meat, chicken, fish, eggs, or tofu. 1 ounce is equal to 1 ounce of meat, chicken, or fish, 1 egg, or 1/4 cup of tofu.  Eat some  foods each day that contain healthy fats, such as avocado, nuts, seeds, and fish. Lifestyle   Check your blood glucose regularly.  Exercise at least 30 minutes 5 or more days each week, or as told by your health care provider.  Take medicines as told by your health care provider.  Do not use any products that contain nicotine or tobacco, such as cigarettes and e-cigarettes. If you need help quitting, ask your health care provider.  Work with a counselor or diabetes educator to identify strategies to manage stress and any emotional and social challenges. What are some questions to ask my health care provider?  Do I need to meet with a diabetes educator?  Do I need to meet with a dietitian?  What number can I call if I have questions?  When are the best times to check my blood glucose? Where to find more information:  American Diabetes Association: diabetes.org/food-and-fitness/food  Academy of Nutrition and Dietetics: www.eatright.org/resources/health/diseases-and-conditions/diabetes  National Institute of Diabetes and Digestive and Kidney Diseases (NIH): www.niddk.nih.gov/health-information/diabetes/overview/diet-eating-physical-activity Summary  A healthy meal plan will   help you control your blood glucose and maintain a healthy lifestyle.  Working with a diet and nutrition specialist (dietitian) can help you make a meal plan that is best for you.  Keep in mind that carbohydrates and alcohol have immediate effects on your blood glucose levels. It is important to count carbohydrates and to use alcohol carefully. This information is not intended to replace advice given to you by your health care provider. Make sure you discuss any questions you have with your health care provider. Document Released: 06/09/2005 Document Revised: 10/17/2016 Document Reviewed: 10/17/2016 Elsevier Interactive Patient Education  2018 Elsevier Inc.  

## 2018-06-20 NOTE — Progress Notes (Signed)
Oscar Fernandez 42 y.o.   Chief Complaint  Patient presents with  . Diabetes    follow up  . Medication Refill    XIGDUO XR    HISTORY OF PRESENT ILLNESS: This is a 42 y.o. male diabetic here for follow-up.  Needs medication refill.  Has no complaints.  Doing well.   Lab Results  Component Value Date   HGBA1C 8.2 (H) 11/01/2017   Wt Readings from Last 3 Encounters:  06/20/18 183 lb (83 kg)  11/01/17 186 lb 3.2 oz (84.5 kg)  07/12/17 181 lb 6.4 oz (82.3 kg)    HPI   Prior to Admission medications   Medication Sig Start Date End Date Taking? Authorizing Provider  acetaminophen (TYLENOL) 500 MG tablet Take 1,000 mg by mouth every 6 (six) hours as needed for mild pain. Reported on 10/29/2015   Yes [provider]  Dapagliflozin-Metformin HCl ER (XIGDUO XR) 01-999 MG TB24 Take 2 tablets by mouth daily. 07/12/17  Yes English, Judeth Cornfield D, PA  pravastatin (PRAVACHOL) 40 MG tablet Take 1 tablet (40 mg total) by mouth daily. 07/12/17  Yes Trena Platt D, PA  aspirin EC 81 MG tablet Take 1 tablet (81 mg total) by mouth daily. Patient not taking: Reported on 11/01/2017 04/18/14   Sherren Mocha, MD  loperamide (IMODIUM) 2 MG capsule Take 1 capsule (2 mg total) by mouth 2 (two) times daily as needed for diarrhea or loose stools. Patient not taking: Reported on 07/12/2017 07/25/16   Garlon Hatchet, PA-C  sitaGLIPtin (JANUVIA) 50 MG tablet Take 1 tablet (50 mg total) by mouth daily. Patient not taking: Reported on 06/20/2018 11/17/17   Trena Platt D, PA  XIGDUO XR 01-999 MG TB24 TAKE 2 TABLETS BY MOUTH ONCE DAILY 12/21/17   Trena Platt D, PA    No Known Allergies  Patient Active Problem List   Diagnosis Date Noted  . Type 2 diabetes mellitus (HCC) 04/19/2014  . Hyperlipidemia LDL goal <100 04/19/2014    Past Medical History:  Diagnosis Date  . Diabetes mellitus without complication (HCC)   . Hyperlipidemia     No past surgical history on file.  Social  History   Socioeconomic History  . Marital status: Married    Spouse name: Building surveyor  . Number of children: 4  . Years of education: college  . Highest education level: Not on file  Occupational History  . Occupation: Chiropodist  . Financial resource strain: Not on file  . Food insecurity:    Worry: Not on file    Inability: Not on file  . Transportation needs:    Medical: Not on file    Non-medical: Not on file  Tobacco Use  . Smoking status: Never Smoker  . Smokeless tobacco: Never Used  Substance and Sexual Activity  . Alcohol use: No  . Drug use: No  . Sexual activity: Yes  Lifestyle  . Physical activity:    Days per week: Not on file    Minutes per session: Not on file  . Stress: Not on file  Relationships  . Social connections:    Talks on phone: Not on file    Gets together: Not on file    Attends religious service: Not on file    Active member of club or organization: Not on file    Attends meetings of clubs or organizations: Not on file    Relationship status: Not on file  . Intimate partner violence:  Fear of current or ex partner: Not on file    Emotionally abused: Not on file    Physically abused: Not on file    Forced sexual activity: Not on file  Other Topics Concern  . Not on file  Social History Narrative  . Not on file    No family history on file.   Review of Systems  Constitutional: Negative.  Negative for chills and fever.  HENT: Negative.  Negative for sore throat.   Eyes: Negative.  Negative for blurred vision and double vision.  Respiratory: Negative.  Negative for cough and shortness of breath.   Cardiovascular: Negative.  Negative for chest pain and palpitations.  Gastrointestinal: Negative.  Negative for abdominal pain, nausea and vomiting.  Genitourinary: Negative.  Negative for dysuria and hematuria.  Musculoskeletal: Negative.  Negative for myalgias.  Skin: Negative.  Negative for rash.  Neurological:  Negative.  Negative for dizziness and headaches.  Endo/Heme/Allergies: Negative.   All other systems reviewed and are negative.   Vitals:   06/20/18 1045  BP: 116/70  Pulse: 74  Resp: 16  Temp: 97.8 F (36.6 C)  SpO2: 97%    Physical Exam  Constitutional: He is oriented to person, place, and time. He appears well-developed and well-nourished.  HENT:  Head: Normocephalic and atraumatic.  Nose: Nose normal.  Mouth/Throat: Oropharynx is clear and moist.  Eyes: Pupils are equal, round, and reactive to light. Conjunctivae are normal.  Neck: Normal range of motion. Neck supple.  Cardiovascular: Normal rate, regular rhythm and normal heart sounds.  Pulmonary/Chest: Effort normal and breath sounds normal.  Abdominal: Soft. Bowel sounds are normal. There is no tenderness.  Musculoskeletal: Normal range of motion.  Neurological: He is alert and oriented to person, place, and time. No sensory deficit. He exhibits normal muscle tone.  Skin: Skin is warm and dry. Capillary refill takes less than 2 seconds.  Psychiatric: He has a normal mood and affect. His behavior is normal.  Vitals reviewed.  Type 2 diabetes mellitus Hemoglobin A1c less than before at 7.8.  Patient admits to missing several doses of medication the past several weeks.  We will continue present meds.  No change at this time.  Follow-up in 3 months.  A total of 25 minutes was spent in the room with the patient, greater than 50% of which was in counseling/coordination of care regarding diabetes, management, medications, nutrition, and need for follow-up.   ASSESSMENT & PLAN: [name] was seen today for diabetes and medication refill.  Diagnoses and all orders for this visit:  Type 2 diabetes mellitus with hyperglycemia, without long-term current use of insulin (HCC) -     POCT glycosylated hemoglobin (Hb A1C) -     POCT glucose (manual entry) -     Ambulatory referral to Ophthalmology -     Comprehensive metabolic  panel    Patient Instructions       If you have lab work done today you will be contacted with your lab results within the next 2 weeks.  If you have not heard from [place] then please contact us. The fastest way to get your results is to register for My Chart.   IF you received an x-ray today, you will receive an invoice from [facility]. Please contact [facility] at [phone] with questions or concerns regarding your invoice.   IF you received labwork today, you will receive an invoice from [facility]. Please contact [facility] at [phone] with questions or concerns regarding your invoice.  Our billing staff will not be able to assist you with questions regarding bills from these companies.  You will be contacted with the lab results as soon as they are available. The fastest way to get your results is to activate your My Chart account. Instructions are located on the last page of this paperwork. If you have not heard from [place] regarding the results in 2 weeks, please contact this office.     Diabetes Mellitus and Nutrition When you have diabetes (diabetes mellitus), it is very important to have healthy eating habits because your blood sugar (glucose) levels are greatly affected by what you eat and drink. Eating healthy foods in the appropriate amounts, at about the same times every day, can help you:  Control your blood glucose.  Lower your risk of heart disease.  Improve your blood pressure.  Reach or maintain a healthy weight.  Every person with diabetes is different, and each person has different needs for a meal plan. Your health care provider may recommend that you work with a diet and nutrition specialist (dietitian) to make a meal plan that is best for you. Your meal plan may vary depending on factors such as:  The calories you need.  The medicines you take.  Your weight.  Your blood glucose, blood pressure, and cholesterol levels.  Your activity  level.  Other health conditions you have, such as heart or kidney disease.  How do carbohydrates affect me? Carbohydrates affect your blood glucose level more than any other type of food. Eating carbohydrates naturally increases the amount of glucose in your blood. Carbohydrate counting is a method for keeping track of how many carbohydrates you eat. Counting carbohydrates is important to keep your blood glucose at a healthy level, especially if you use insulin or take certain oral diabetes medicines. It is important to know how many carbohydrates you can safely have in each meal. This is different for every person. Your dietitian can help you calculate how many carbohydrates you should have at each meal and for snack. Foods that contain carbohydrates include:  Bread, cereal, rice, pasta, and crackers.  Potatoes and corn.  Peas, beans, and lentils.  Milk and yogurt.  Fruit and juice.  Desserts, such as cakes, cookies, ice cream, and candy.  How does alcohol affect me? Alcohol can cause a sudden decrease in blood glucose (hypoglycemia), especially if you use insulin or take certain oral diabetes medicines. Hypoglycemia can be a life-threatening condition. Symptoms of hypoglycemia (sleepiness, dizziness, and confusion) are similar to symptoms of having too much alcohol. If your health care provider says that alcohol is safe for you, follow these guidelines:  Limit alcohol intake to no more than 1 drink per day for nonpregnant women and 2 drinks per day for men. One drink equals 12 oz of beer, 5 oz of wine, or 1 oz of hard liquor.  Do not drink on an empty stomach.  Keep yourself hydrated with water, diet soda, or unsweetened iced tea.  Keep in mind that regular soda, juice, and other mixers may contain a lot of sugar and must be counted as carbohydrates.  What are tips for following this plan? Reading food labels  Start by checking the serving size on the label. The amount of  calories, carbohydrates, fats, and other nutrients listed on the label are based on one serving of the food. Many foods contain more than one serving per package.  Check the total grams (g) of carbohydrates in one serving. You  can calculate the number of servings of carbohydrates in one serving by dividing the total carbohydrates by 15. For example, if a food has 30 g of total carbohydrates, it would be equal to 2 servings of carbohydrates.  Check the number of grams (g) of saturated and trans fats in one serving. Choose foods that have low or no amount of these fats.  Check the number of milligrams (mg) of sodium in one serving. Most people should limit total sodium intake to less than 2,300 mg per day.  Always check the nutrition information of foods labeled as "low-fat" or "nonfat". These foods may be higher in added sugar or refined carbohydrates and should be avoided.  Talk to your dietitian to identify your daily goals for nutrients listed on the label. Shopping  Avoid buying canned, premade, or processed foods. These foods tend to be high in fat, sodium, and added sugar.  Shop around the outside edge of the grocery store. This includes fresh fruits and vegetables, bulk grains, fresh meats, and fresh dairy. Cooking  Use low-heat cooking methods, such as baking, instead of high-heat cooking methods like deep frying.  Cook using healthy oils, such as olive, canola, or sunflower oil.  Avoid cooking with butter, cream, or high-fat meats. Meal planning  Eat meals and snacks regularly, preferably at the same times every day. Avoid going long periods of time without eating.  Eat foods high in fiber, such as fresh fruits, vegetables, beans, and whole grains. Talk to your dietitian about how many servings of carbohydrates you can eat at each meal.  Eat 4-6 ounces of lean protein each day, such as lean meat, chicken, fish, eggs, or tofu. 1 ounce is equal to 1 ounce of meat, chicken, or fish,  1 egg, or 1/4 cup of tofu.  Eat some foods each day that contain healthy fats, such as avocado, nuts, seeds, and fish. Lifestyle   Check your blood glucose regularly.  Exercise at least 30 minutes 5 or more days each week, or as told by your health care provider.  Take medicines as told by your health care provider.  Do not use any products that contain nicotine or tobacco, such as cigarettes and e-cigarettes. If you need help quitting, ask your health care provider.  Work with a Veterinary surgeon or diabetes educator to identify strategies to manage stress and any emotional and social challenges. What are some questions to ask my health care provider?  Do I need to meet with a diabetes educator?  Do I need to meet with a dietitian?  What number can I call if I have questions?  When are the best times to check my blood glucose? Where to find more information:  American Diabetes Association: diabetes.org/food-and-fitness/food  Academy of Nutrition and Dietetics: https://www.vargas.com/  [employer] of Diabetes and Digestive and Kidney Diseases (NIH): FindJewelers.cz Summary  A healthy meal plan will help you control your blood glucose and maintain a healthy lifestyle.  Working with a diet and nutrition specialist (dietitian) can help you make a meal plan that is best for you.  Keep in mind that carbohydrates and alcohol have immediate effects on your blood glucose levels. It is important to count carbohydrates and to use alcohol carefully. This information is not intended to replace advice given to you by your health care provider. Make sure you discuss any questions you have with your health care provider. Document Released: 06/09/2005 Document Revised: 10/17/2016 Document Reviewed: 10/17/2016 Elsevier Interactive Patient Education  2018 Elsevier  Inc.      Agustina Caroli, MD [facility]

## 2018-06-21 LAB — COMPREHENSIVE METABOLIC PANEL
A/G RATIO: 1.8 (ref 1.2–2.2)
ALK PHOS: 56 IU/L (ref 39–117)
ALT: 16 IU/L (ref 0–44)
AST: 13 IU/L (ref 0–40)
Albumin: 4.3 g/dL (ref 3.5–5.5)
BUN/Creatinine Ratio: 32 — ABNORMAL HIGH (ref 9–20)
BUN: 23 mg/dL (ref 6–24)
Bilirubin Total: 0.9 mg/dL (ref 0.0–1.2)
CALCIUM: 9.4 mg/dL (ref 8.7–10.2)
CO2: 23 mmol/L (ref 20–29)
Chloride: 101 mmol/L (ref 96–106)
Creatinine, Ser: 0.72 mg/dL — ABNORMAL LOW (ref 0.76–1.27)
GFR calc Af Amer: 133 mL/min/{1.73_m2} (ref >59)
GFR, EST NON AFRICAN AMERICAN: 115 mL/min/{1.73_m2} (ref >59)
GLOBULIN, TOTAL: 2.4 g/dL (ref 1.5–4.5)
Glucose: 137 mg/dL — ABNORMAL HIGH (ref 65–99)
POTASSIUM: 4.1 mmol/L (ref 3.5–5.2)
SODIUM: 139 mmol/L (ref 134–144)
Total Protein: 6.7 g/dL (ref 6.0–8.5)

## 2018-06-22 ENCOUNTER — Encounter: Payer: Self-pay | Admitting: *Deleted

## 2018-07-31 LAB — HM DIABETES EYE EXAM

## 2018-08-21 ENCOUNTER — Encounter: Payer: Self-pay | Admitting: Family Medicine

## 2018-09-18 ENCOUNTER — Ambulatory Visit: Payer: BLUE CROSS/BLUE SHIELD | Admitting: Family Medicine

## 2018-10-03 ENCOUNTER — Telehealth: Payer: Self-pay | Admitting: Family Medicine

## 2018-10-03 NOTE — Telephone Encounter (Signed)
MyChart message sent to pt about their appointment on 10/16/18 with Dr Shaw °

## 2018-10-16 ENCOUNTER — Ambulatory Visit (INDEPENDENT_AMBULATORY_CARE_PROVIDER_SITE_OTHER): Payer: BLUE CROSS/BLUE SHIELD | Admitting: Emergency Medicine

## 2018-10-16 ENCOUNTER — Telehealth: Payer: Self-pay | Admitting: Family Medicine

## 2018-10-16 ENCOUNTER — Encounter: Payer: Self-pay | Admitting: Emergency Medicine

## 2018-10-16 ENCOUNTER — Ambulatory Visit: Payer: BLUE CROSS/BLUE SHIELD | Admitting: Family Medicine

## 2018-10-16 VITALS — BP 111/74 | HR 69 | Temp 98.3°F | Resp 17 | Ht 66.0 in | Wt 183.0 lb

## 2018-10-16 DIAGNOSIS — E1165 Type 2 diabetes mellitus with hyperglycemia: Secondary | ICD-10-CM | POA: Diagnosis not present

## 2018-10-16 LAB — POCT GLYCOSYLATED HEMOGLOBIN (HGB A1C): Hemoglobin A1C: 8.1 % — AB (ref 4.0–5.6)

## 2018-10-16 LAB — GLUCOSE, POCT (MANUAL RESULT ENTRY): POC Glucose: 147 mg/dl — AB (ref 70–99)

## 2018-10-16 MED ORDER — PRAVASTATIN SODIUM 40 MG PO TABS: 40.0000 mg | ORAL_TABLET | Freq: Every day | ORAL | 3 refills | Status: DC

## 2018-10-16 MED ORDER — DAPAGLIFLOZIN PRO-METFORMIN ER 5-1000 MG PO TB24: 2.0000 | ORAL_TABLET | Freq: Every day | ORAL | 3 refills | Status: DC

## 2018-10-16 NOTE — Assessment & Plan Note (Signed)
Uncontrolled diabetes.  Hemoglobin A1c at 8.1 today.  Patient has been off medications for several weeks.  Restart previous medications and follow-up in 3 months.  Diet and exercise recommended.

## 2018-10-16 NOTE — Patient Instructions (Addendum)
   If you have lab work done today you will be contacted with your lab results within the next 2 weeks.  If you have not heard from us then please contact us. The fastest way to get your results is to register for My Chart.   IF you received an x-ray today, you will receive an invoice from [facility]. Please contact [facility] at [phone] with questions or concerns regarding your invoice.   IF you received labwork today, you will receive an invoice from [facility]. Please contact [facility] at [phone] with questions or concerns regarding your invoice.   Our billing staff will not be able to assist you with questions regarding bills from these companies.  You will be contacted with the lab results as soon as they are available. The fastest way to get your results is to activate your My Chart account. Instructions are located on the last page of this paperwork. If you have not heard from us regarding the results in 2 weeks, please contact this office.     Diabetes Mellitus and Nutrition, Adult When you have diabetes (diabetes mellitus), it is very important to have healthy eating habits because your blood sugar (glucose) levels are greatly affected by what you eat and drink. Eating healthy foods in the appropriate amounts, at about the same times every day, can help you:  Control your blood glucose.  Lower your risk of heart disease.  Improve your blood pressure.  Reach or maintain a healthy weight. Every person with diabetes is different, and each person has different needs for a meal plan. Your health care provider may recommend that you work with a diet and nutrition specialist (dietitian) to make a meal plan that is best for you. Your meal plan may vary depending on factors such as:  The calories you need.  The medicines you take.  Your weight.  Your blood glucose, blood pressure, and cholesterol levels.  Your activity level.  Other health conditions  you have, such as heart or kidney disease. How do carbohydrates affect me? Carbohydrates, also called carbs, affect your blood glucose level more than any other type of food. Eating carbs naturally raises the amount of glucose in your blood. Carb counting is a method for keeping track of how many carbs you eat. Counting carbs is important to keep your blood glucose at a healthy level, especially if you use insulin or take certain oral diabetes medicines. It is important to know how many carbs you can safely have in each meal. This is different for every person. Your dietitian can help you calculate how many carbs you should have at each meal and for each snack. Foods that contain carbs include:  Bread, cereal, rice, pasta, and crackers.  Potatoes and corn.  Peas, beans, and lentils.  Milk and yogurt.  Fruit and juice.  Desserts, such as cakes, cookies, ice cream, and candy. How does alcohol affect me? Alcohol can cause a sudden decrease in blood glucose (hypoglycemia), especially if you use insulin or take certain oral diabetes medicines. Hypoglycemia can be a life-threatening condition. Symptoms of hypoglycemia (sleepiness, dizziness, and confusion) are similar to symptoms of having too much alcohol. If your health care provider says that alcohol is safe for you, follow these guidelines:  Limit alcohol intake to no more than 1 drink per day for nonpregnant women and 2 drinks per day for men. One drink equals 12 oz of beer, 5 oz of wine, or 1 oz of hard liquor.    Do not drink on an empty stomach.  Keep yourself hydrated with water, diet soda, or unsweetened iced tea.  Keep in mind that regular soda, juice, and other mixers may contain a lot of sugar and must be counted as carbs. What are tips for following this plan?  Reading food labels  Start by checking the serving size on the "Nutrition Facts" label of packaged foods and drinks. The amount of calories, carbs, fats, and other  nutrients listed on the label is based on one serving of the item. Many items contain more than one serving per package.  Check the total grams (g) of carbs in one serving. You can calculate the number of servings of carbs in one serving by dividing the total carbs by 15. For example, if a food has 30 g of total carbs, it would be equal to 2 servings of carbs.  Check the number of grams (g) of saturated and trans fats in one serving. Choose foods that have low or no amount of these fats.  Check the number of milligrams (mg) of salt (sodium) in one serving. Most people should limit total sodium intake to less than 2,300 mg per day.  Always check the nutrition information of foods labeled as "low-fat" or "nonfat". These foods may be higher in added sugar or refined carbs and should be avoided.  Talk to your dietitian to identify your daily goals for nutrients listed on the label. Shopping  Avoid buying canned, premade, or processed foods. These foods tend to be high in fat, sodium, and added sugar.  Shop around the outside edge of the grocery store. This includes fresh fruits and vegetables, bulk grains, fresh meats, and fresh dairy. Cooking  Use low-heat cooking methods, such as baking, instead of high-heat cooking methods like deep frying.  Cook using healthy oils, such as olive, canola, or sunflower oil.  Avoid cooking with butter, cream, or high-fat meats. Meal planning  Eat meals and snacks regularly, preferably at the same times every day. Avoid going long periods of time without eating.  Eat foods high in fiber, such as fresh fruits, vegetables, beans, and whole grains. Talk to your dietitian about how many servings of carbs you can eat at each meal.  Eat 4-6 ounces (oz) of lean protein each day, such as lean meat, chicken, fish, eggs, or tofu. One oz of lean protein is equal to: ? 1 oz of meat, chicken, or fish. ? 1 egg. ?  cup of tofu.  Eat some foods each day that contain  healthy fats, such as avocado, nuts, seeds, and fish. Lifestyle  Check your blood glucose regularly.  Exercise regularly as told by your health care provider. This may include: ? 150 minutes of moderate-intensity or vigorous-intensity exercise each week. This could be brisk walking, biking, or water aerobics. ? Stretching and doing strength exercises, such as yoga or weightlifting, at least 2 times a week.  Take medicines as told by your health care provider.  Do not use any products that contain nicotine or tobacco, such as cigarettes and e-cigarettes. If you need help quitting, ask your health care provider.  Work with a counselor or diabetes educator to identify strategies to manage stress and any emotional and social challenges. Questions to ask a health care provider  Do I need to meet with a diabetes educator?  Do I need to meet with a dietitian?  What number can I call if I have questions?  When are the best times to   check my blood glucose? Where to find more information:  American Diabetes Association: diabetes.org  Academy of Nutrition and Dietetics: www.eatright.org  National Institute of Diabetes and Digestive and Kidney Diseases (NIH): www.niddk.nih.gov Summary  A healthy meal plan will help you control your blood glucose and maintain a healthy lifestyle.  Working with a diet and nutrition specialist (dietitian) can help you make a meal plan that is best for you.  Keep in mind that carbohydrates (carbs) and alcohol have immediate effects on your blood glucose levels. It is important to count carbs and to use alcohol carefully. This information is not intended to replace advice given to you by your health care provider. Make sure you discuss any questions you have with your health care provider. Document Released: 06/09/2005 Document Revised: 04/12/2017 Document Reviewed: 10/17/2016 Elsevier Interactive Patient Education  2019 Elsevier Inc.  

## 2018-10-16 NOTE — Progress Notes (Signed)
Oscar Fernandez 43 y.o.   Chief Complaint  Patient presents with  . Follow-up    diabetes  . Medication Refill    HISTORY OF PRESENT ILLNESS: This is a 43 y.o. male with history of diabetes here for follow-up and medication refill.  Has no complaints or medical concerns.  Doing well.  However has been without medication for the past several days.  Lab Results  Component Value Date   HGBA1C 7.8 (A) 06/20/2018   BP Readings from Last 3 Encounters:  10/16/18 111/74  06/20/18 116/70  11/01/17 129/87   Lab Results  Component Value Date   CHOL 122 (L) 10/29/2015   HDL 33 (L) 10/29/2015   LDLCALC 78 10/29/2015   TRIG 54 10/29/2015   CHOLHDL 3.7 10/29/2015   Wt Readings from Last 3 Encounters:  10/16/18 183 lb (83 kg)  06/20/18 183 lb (83 kg)  11/01/17 186 lb 3.2 oz (84.5 kg)    HPI   Prior to Admission medications   Medication Sig Start Date End Date Taking? Authorizing Provider  acetaminophen (TYLENOL) 500 MG tablet Take 1,000 mg by mouth every 6 (six) hours as needed for mild pain. Reported on 10/29/2015   Yes [provider]  aspirin EC 81 MG tablet Take 1 tablet (81 mg total) by mouth daily. 04/18/14  Yes Sherren MochaShaw, Eva N, MD  Dapagliflozin-metFORMIN HCl ER (XIGDUO XR) 01-999 MG TB24 Take 2 tablets by mouth daily. 06/20/18  Yes Jaleah Lefevre, Eilleen KempfMiguel Jose, MD  pravastatin (PRAVACHOL) 40 MG tablet Take 1 tablet (40 mg total) by mouth daily. 06/20/18  Yes Kanyon Seibold, Eilleen KempfMiguel Jose, MD  loperamide (IMODIUM) 2 MG capsule Take 1 capsule (2 mg total) by mouth 2 (two) times daily as needed for diarrhea or loose stools. Patient not taking: Reported on 07/12/2017 07/25/16   Garlon HatchetSanders, Lisa M, PA-C  sitaGLIPtin (JANUVIA) 50 MG tablet Take 1 tablet (50 mg total) by mouth daily. Patient not taking: Reported on 06/20/2018 11/17/17   Trena PlattEnglish, Stephanie D, PA    No Known Allergies  Patient Active Problem List   Diagnosis Date Noted  . Type 2 diabetes mellitus (HCC) 04/19/2014  . Hyperlipidemia  LDL goal <100 04/19/2014    Past Medical History:  Diagnosis Date  . Diabetes mellitus without complication (HCC)   . Hyperlipidemia     No past surgical history on file.  Social History   Socioeconomic History  . Marital status: Married    Spouse name: Building surveyorMoutalahatou  . Number of children: 4  . Years of education: college  . Highest education level: Not on file  Occupational History  . Occupation: Chiropodistindustrial  Social Needs  . Financial resource strain: Not on file  . Food insecurity:    Worry: Not on file    Inability: Not on file  . Transportation needs:    Medical: Not on file    Non-medical: Not on file  Tobacco Use  . Smoking status: Never Smoker  . Smokeless tobacco: Never Used  Substance and Sexual Activity  . Alcohol use: No  . Drug use: No  . Sexual activity: Yes  Lifestyle  . Physical activity:    Days per week: Not on file    Minutes per session: Not on file  . Stress: Not on file  Relationships  . Social connections:    Talks on phone: Not on file    Gets together: Not on file    Attends religious service: Not on file    Active member of club  or organization: Not on file    Attends meetings of clubs or organizations: Not on file    Relationship status: Not on file  . Intimate partner violence:    Fear of current or ex partner: Not on file    Emotionally abused: Not on file    Physically abused: Not on file    Forced sexual activity: Not on file  Other Topics Concern  . Not on file  Social History Narrative  . Not on file    No family history on file.   Review of Systems  Constitutional: Negative.  Negative for chills, fever and weight loss.  HENT: Negative.  Negative for sore throat.   Eyes: Negative.  Negative for blurred vision.  Respiratory: Negative.  Negative for cough and shortness of breath.   Cardiovascular: Negative.  Negative for chest pain and palpitations.  Gastrointestinal: Negative.  Negative for abdominal pain, blood in  stool, diarrhea, melena, nausea and vomiting.  Genitourinary: Negative.  Negative for dysuria and frequency.  Musculoskeletal: Negative.  Negative for myalgias and neck pain.  Skin: Negative.  Negative for rash.  Neurological: Negative.  Negative for dizziness and headaches.  All other systems reviewed and are negative.   Vitals:   10/16/18 1420  BP: 111/74  Pulse: 69  Resp: 17  Temp: 98.3 F (36.8 C)  SpO2: 98%    Physical Exam Vitals signs reviewed.  Constitutional:      Appearance: Normal appearance.  HENT:     Head: Normocephalic and atraumatic.     Nose: Nose normal.     Mouth/Throat:     Mouth: Mucous membranes are moist.     Pharynx: Oropharynx is clear.  Eyes:     Extraocular Movements: Extraocular movements intact.     Conjunctiva/sclera: Conjunctivae normal.     Pupils: Pupils are equal, round, and reactive to light.  Neck:     Musculoskeletal: Normal range of motion and neck supple.  Cardiovascular:     Rate and Rhythm: Normal rate and regular rhythm.     Heart sounds: Normal heart sounds.  Pulmonary:     Effort: Pulmonary effort is normal.     Breath sounds: Normal breath sounds.  Abdominal:     General: There is no distension.     Palpations: Abdomen is soft.     Tenderness: There is no abdominal tenderness.  Musculoskeletal: Normal range of motion.  Skin:    General: Skin is warm and dry.     Capillary Refill: Capillary refill takes less than 2 seconds.  Neurological:     General: No focal deficit present.     Mental Status: He is alert and oriented to person, place, and time.  Psychiatric:        Mood and Affect: Mood normal.        Behavior: Behavior normal.    Results for orders placed or performed in visit on 10/16/18 (from the past 24 hour(s))  POCT glucose (manual entry)     Status: Abnormal   Collection Time: 10/16/18  3:17 PM  Result Value Ref Range   POC Glucose 147 (A) 70 - 99 mg/dl  POCT glycosylated hemoglobin (Hb A1C)     Status:  Abnormal   Collection Time: 10/16/18  3:31 PM  Result Value Ref Range   Hemoglobin A1C 8.1 (A) 4.0 - 5.6 %   HbA1c POC (<> result, manual entry)     HbA1c, POC (prediabetic range)     HbA1c, POC (controlled diabetic  range)     A total of 40 minutes was spent in the room with the patient, greater than 50% of which was in counseling/coordination of care regarding diabetes, treatment, notes, diet and exercise, blood results, and need for follow-up in 3 months..   ASSESSMENT & PLAN: Type 2 diabetes mellitus Uncontrolled diabetes.  Hemoglobin A1c at 8.1 today.  Patient has been off medications for several weeks.  Restart previous medications and follow-up in 3 months.  Diet and exercise recommended.  Jabe was seen today for follow-up and medication refill.  Diagnoses and all orders for this visit:  Type 2 diabetes mellitus with hyperglycemia, without long-term current use of insulin (HCC) -     POCT glucose (manual entry) -     POCT glycosylated hemoglobin (Hb A1C) -     CBC with Differential/Platelet -     Comprehensive metabolic panel -     Lipid panel -     TSH -     HM Diabetes Foot Exam -     Dapagliflozin-metFORMIN HCl ER (XIGDUO XR) 01-999 MG TB24; Take 2 tablets by mouth daily. -     pravastatin (PRAVACHOL) 40 MG tablet; Take 1 tablet (40 mg total) by mouth daily.    Patient Instructions       If you have lab work done today you will be contacted with your lab results within the next 2 weeks.  If you have not heard from us then please contact us. The fastest way to get your results is to register for My Chart.   IF you received an x-ray today, you will receive an invoice [facility]. Please contact [facility] at [phone] with questions or concerns regarding your invoice.   IF you received labwork today, you will receive an invoice from [facility]. Please contact [facility] at [phone] with questions or concerns regarding your invoice.    Our billing staff will not be able to assist you with questions regarding bills from these companies.  You will be contacted with the lab results as soon as they are available. The fastest way to get your results is to activate your My Chart account. Instructions are located on the last page of this paperwork. If you have not heard from us regarding the results in 2 weeks, please contact this office.      Diabetes Mellitus and Nutrition, Adult When you have diabetes (diabetes mellitus), it is very important to have healthy eating habits because your blood sugar (glucose) levels are greatly affected by what you eat and drink. Eating healthy foods in the appropriate amounts, at about the same times every day, can help you:  Control your blood glucose.  Lower your risk of heart disease.  Improve your blood pressure.  Reach or maintain a healthy weight. Every person with diabetes is different, and each person has different needs for a meal plan. Your health care provider may recommend that you work with a diet and nutrition specialist (dietitian) to make a meal plan that is best for you. Your meal plan may vary depending on factors such as:  The calories you need.  The medicines you take.  Your weight.  Your blood glucose, blood pressure, and cholesterol levels.  Your activity level.  Other health conditions you have, such as heart or kidney disease. How do carbohydrates affect me? Carbohydrates, also called carbs, affect your blood glucose level more than any other type of food. Eating carbs naturally raises the amount of glucose in your  blood. Carb counting is a method for keeping track of how many carbs you eat. Counting carbs is important to keep your blood glucose at a healthy level, especially if you use insulin or take certain oral diabetes medicines. It is important to know how many carbs you can safely have in each meal. This is different for every person. Your dietitian can  help you calculate how many carbs you should have at each meal and for each snack. Foods that contain carbs include:  Bread, cereal, rice, pasta, and crackers.  Potatoes and corn.  Peas, beans, and lentils.  Milk and yogurt.  Fruit and juice.  Desserts, such as cakes, cookies, ice cream, and candy. How does alcohol affect me? Alcohol can cause a sudden decrease in blood glucose (hypoglycemia), especially if you use insulin or take certain oral diabetes medicines. Hypoglycemia can be a life-threatening condition. Symptoms of hypoglycemia (sleepiness, dizziness, and confusion) are similar to symptoms of having too much alcohol. If your health care provider says that alcohol is safe for you, follow these guidelines:  Limit alcohol intake to no more than 1 drink per day for nonpregnant women and 2 drinks per day for men. One drink equals 12 oz of beer, 5 oz of wine, or 1 oz of hard liquor.  Do not drink on an empty stomach.  Keep yourself hydrated with water, diet soda, or unsweetened iced tea.  Keep in mind that regular soda, juice, and other mixers may contain a lot of sugar and must be counted as carbs. What are tips for following this plan?  Reading food labels  Start by checking the serving size on the "Nutrition Facts" label of packaged foods and drinks. The amount of calories, carbs, fats, and other nutrients listed on the label is based on one serving of the item. Many items contain more than one serving per package.  Check the total grams (g) of carbs in one serving. You can calculate the number of servings of carbs in one serving by dividing the total carbs by 15. For example, if a food has 30 g of total carbs, it would be equal to 2 servings of carbs.  Check the number of grams (g) of saturated and trans fats in one serving. Choose foods that have low or no amount of these fats.  Check the number of milligrams (mg) of salt (sodium) in one serving. Most people should limit  total sodium intake to less than 2,300 mg per day.  Always check the nutrition information of foods labeled as "low-fat" or "nonfat". These foods may be higher in added sugar or refined carbs and should be avoided.  Talk to your dietitian to identify your daily goals for nutrients listed on the label. Shopping  Avoid buying canned, premade, or processed foods. These foods tend to be high in fat, sodium, and added sugar.  Shop around the outside edge of the grocery store. This includes fresh fruits and vegetables, bulk grains, fresh meats, and fresh dairy. Cooking  Use low-heat cooking methods, such as baking, instead of high-heat cooking methods like deep frying.  Cook using healthy oils, such as olive, canola, or sunflower oil.  Avoid cooking with butter, cream, or high-fat meats. Meal planning  Eat meals and snacks regularly, preferably at the same times every day. Avoid going long periods of time without eating.  Eat foods high in fiber, such as fresh fruits, vegetables, beans, and whole grains. Talk to your dietitian about how many servings of carbs  you can eat at each meal.  Eat 4-6 ounces (oz) of lean protein each day, such as lean meat, chicken, fish, eggs, or tofu. One oz of lean protein is equal to: ? 1 oz of meat, chicken, or fish. ? 1 egg. ?  cup of tofu.  Eat some foods each day that contain healthy fats, such as avocado, nuts, seeds, and fish. Lifestyle  Check your blood glucose regularly.  Exercise regularly as told by your health care provider. This may include: ? 150 minutes of moderate-intensity or vigorous-intensity exercise each week. This could be brisk walking, biking, or water aerobics. ? Stretching and doing strength exercises, such as yoga or weightlifting, at least 2 times a week.  Take medicines as told by your health care provider.  Do not use any products that contain nicotine or tobacco, such as cigarettes and e-cigarettes. If you need help  quitting, ask your health care provider.  Work with a Veterinary surgeon or diabetes educator to identify strategies to manage stress and any emotional and social challenges. Questions to ask a health care provider  Do I need to meet with a diabetes educator?  Do I need to meet with a dietitian?  What number can I call if I have questions?  When are the best times to check my blood glucose? Where to find more information:  American Diabetes Association: diabetes.org  Academy of Nutrition and Dietetics: www.eatright.[employer] of Diabetes and Digestive and Kidney Diseases (NIH): CarFlippers.tn Summary  A healthy meal plan will help you control your blood glucose and maintain a healthy lifestyle.  Working with a diet and nutrition specialist (dietitian) can help you make a meal plan that is best for you.  Keep in mind that carbohydrates (carbs) and alcohol have immediate effects on your blood glucose levels. It is important to count carbs and to use alcohol carefully. This information is not intended to replace advice given to you by your health care provider. Make sure you discuss any questions you have with your health care provider. Document Released: 06/09/2005 Document Revised: 04/12/2017 Document Reviewed: 10/17/2016 Elsevier Interactive Patient Education  2019 Elsevier Inc.      Edwina Barth, MD [facility]

## 2018-10-16 NOTE — Telephone Encounter (Signed)
Please advise 

## 2018-10-16 NOTE — Telephone Encounter (Signed)
Copied from CRM [id number]. Topic: Quick Communication - See Telephone Encounter >> Oct 16, 2018  5:37 PM Lorrine Kin, NT wrote: CRM for notification. See Telephone encounter for: 10/16/18. Patient calling and states that at his appointment today, he requested having a medication change. States that he would like to be switched to regular Metformin, because the Dapagliflozin-metFORMIN HCl ER (XIGDUO XR) 01-999 MG TB24 is too expensive since insurance no longer covers this medication. Please advise. [facility] [address], [place] - [address]

## 2018-10-17 LAB — CBC WITH DIFFERENTIAL/PLATELET
BASOS ABS: 0 10*3/uL (ref 0.0–0.2)
BASOS: 1 %
EOS (ABSOLUTE): 0.2 10*3/uL (ref 0.0–0.4)
Eos: 4 %
HEMOGLOBIN: 15.4 g/dL (ref 13.0–17.7)
Hematocrit: 44.3 % (ref 37.5–51.0)
IMMATURE GRANS (ABS): 0 10*3/uL (ref 0.0–0.1)
IMMATURE GRANULOCYTES: 0 %
LYMPHS: 43 %
Lymphocytes Absolute: 1.9 10*3/uL (ref 0.7–3.1)
MCH: 28.9 pg (ref 26.6–33.0)
MCHC: 34.8 g/dL (ref 31.5–35.7)
MCV: 83 fL (ref 79–97)
MONOCYTES: 8 %
Monocytes Absolute: 0.4 10*3/uL (ref 0.1–0.9)
NEUTROS ABS: 2 10*3/uL (ref 1.4–7.0)
NEUTROS PCT: 44 %
PLATELETS: 194 10*3/uL (ref 150–450)
RBC: 5.32 x10E6/uL (ref 4.14–5.80)
RDW: 13.3 % (ref 11.6–15.4)
WBC: 4.4 10*3/uL (ref 3.4–10.8)

## 2018-10-17 LAB — LIPID PANEL
CHOL/HDL RATIO: 4.7 ratio (ref 0.0–5.0)
Cholesterol, Total: 180 mg/dL (ref 100–199)
HDL: 38 mg/dL — AB (ref >39)
LDL CALC: 127 mg/dL — AB (ref 0–99)
Triglycerides: 75 mg/dL (ref 0–149)
VLDL CHOLESTEROL CAL: 15 mg/dL (ref 5–40)

## 2018-10-17 LAB — COMPREHENSIVE METABOLIC PANEL
ALBUMIN: 4.3 g/dL (ref 4.0–5.0)
ALT: 27 IU/L (ref 0–44)
AST: 22 IU/L (ref 0–40)
Albumin/Globulin Ratio: 2 (ref 1.2–2.2)
Alkaline Phosphatase: 65 IU/L (ref 39–117)
BILIRUBIN TOTAL: 0.5 mg/dL (ref 0.0–1.2)
BUN / CREAT RATIO: 29 — AB (ref 9–20)
BUN: 21 mg/dL (ref 6–24)
CALCIUM: 9.5 mg/dL (ref 8.7–10.2)
CHLORIDE: 103 mmol/L (ref 96–106)
CO2: 24 mmol/L (ref 20–29)
CREATININE: 0.73 mg/dL — AB (ref 0.76–1.27)
GFR, EST AFRICAN AMERICAN: 131 mL/min/{1.73_m2} (ref >59)
GFR, EST NON AFRICAN AMERICAN: 114 mL/min/{1.73_m2} (ref >59)
GLUCOSE: 145 mg/dL — AB (ref 65–99)
Globulin, Total: 2.1 g/dL (ref 1.5–4.5)
Potassium: 4.1 mmol/L (ref 3.5–5.2)
Sodium: 142 mmol/L (ref 134–144)
TOTAL PROTEIN: 6.4 g/dL (ref 6.0–8.5)

## 2018-10-17 LAB — TSH: TSH: 0.343 u[IU]/mL — AB (ref 0.450–4.500)

## 2018-10-17 MED ORDER — CANAGLIFLOZIN-METFORMIN HCL ER 50-1000 MG PO TB24: 2.0000 | ORAL_TABLET | Freq: Every day | ORAL | 3 refills | Status: AC

## 2018-10-17 NOTE — Addendum Note (Signed)
Addended by: Evie Lacks on: 10/17/2018 04:48 PM   Modules accepted: Orders

## 2018-10-18 ENCOUNTER — Encounter: Payer: Self-pay | Admitting: *Deleted

## 2018-11-01 ENCOUNTER — Telehealth: Payer: Self-pay | Admitting: Emergency Medicine

## 2018-11-01 NOTE — Telephone Encounter (Signed)
Called patient in regards to his appt with Dr. Alvy Bimler on 01/15/2019. The provider will be out of the office on that day and needs to be rescheduled. Someone answered the phone but hung up on me twice.

## 2018-11-13 NOTE — Telephone Encounter (Signed)
Called and spoke with pt regarding his appt with Dr. Alvy Bimler on 4/21. Dr. Alvy Bimler will be out of the office that day. I asked pt if Dr. Clelia Croft or Dr. Alvy Bimler was his PCP - pt states that Dr. Clelia Croft is PCP. I was able to make pt an appt with Dr. Clelia Croft for 4/21 at 8:00. I advised pt of time and late policy as well as fasting for diabetes check up. Pt acknowledged.

## 2018-11-27 ENCOUNTER — Telehealth: Payer: Self-pay | Admitting: Family Medicine

## 2018-11-27 NOTE — Telephone Encounter (Signed)
mychart message sent to pt about their appointment with Dr Shaw °

## 2019-01-15 ENCOUNTER — Ambulatory Visit: Payer: BLUE CROSS/BLUE SHIELD | Admitting: Family Medicine

## 2019-01-15 ENCOUNTER — Ambulatory Visit: Payer: BLUE CROSS/BLUE SHIELD | Admitting: Emergency Medicine

## 2019-04-10 ENCOUNTER — Telehealth: Payer: Self-pay | Admitting: Emergency Medicine

## 2019-04-10 NOTE — Telephone Encounter (Signed)
Pt states that the Invokana is no longer covered by his insurance and wants to be switched to metformin 1000 mg. Please advise

## 2019-04-11 ENCOUNTER — Encounter: Payer: Self-pay | Admitting: Emergency Medicine

## 2019-04-11 ENCOUNTER — Other Ambulatory Visit: Payer: Self-pay

## 2019-04-11 ENCOUNTER — Ambulatory Visit (INDEPENDENT_AMBULATORY_CARE_PROVIDER_SITE_OTHER): Payer: BC Managed Care – PPO | Admitting: Emergency Medicine

## 2019-04-11 VITALS — BP 130/82 | HR 89 | Temp 98.5°F | Ht 67.0 in | Wt 193.2 lb

## 2019-04-11 DIAGNOSIS — E785 Hyperlipidemia, unspecified: Secondary | ICD-10-CM

## 2019-04-11 DIAGNOSIS — E1169 Type 2 diabetes mellitus with other specified complication: Secondary | ICD-10-CM | POA: Diagnosis not present

## 2019-04-11 DIAGNOSIS — E1165 Type 2 diabetes mellitus with hyperglycemia: Secondary | ICD-10-CM | POA: Diagnosis not present

## 2019-04-11 MED ORDER — TRULICITY 0.75 MG/0.5ML ~~LOC~~ SOAJ: 0.7500 mg | SUBCUTANEOUS | 5 refills | Status: DC

## 2019-04-11 MED ORDER — METFORMIN HCL 1000 MG PO TABS: 1000.0000 mg | ORAL_TABLET | Freq: Two times a day (BID) | ORAL | 3 refills | Status: DC

## 2019-04-11 NOTE — Telephone Encounter (Signed)
Yes, it has been a while since the last time I saw him.  Please schedule OV.  Thanks.

## 2019-04-11 NOTE — Telephone Encounter (Signed)
Please advise on message below. Pt was last seen on 09/2018 with you. Does the pt have to come back to be seen for DM for switching medication

## 2019-04-11 NOTE — Assessment & Plan Note (Signed)
Uncontrolled diabetes.  Insurance will no longer cover for invokamet.  Will start metformin 1540 g twice and Trulicity 0.86 mg weekly.  Importance of diet and exercise discussed.  Follow-up in 3 months.

## 2019-04-11 NOTE — Patient Instructions (Addendum)
   If you have lab work done today you will be contacted with your lab results within the next 2 weeks.  If you have not heard from us then please contact us. The fastest way to get your results is to register for My Chart.   IF you received an x-ray today, you will receive an invoice from [facility]. Please contact [facility] at [phone] with questions or concerns regarding your invoice.   IF you received labwork today, you will receive an invoice from [facility]. Please contact [facility] at [phone] with questions or concerns regarding your invoice.   Our billing staff will not be able to assist you with questions regarding bills from these companies.  You will be contacted with the lab results as soon as they are available. The fastest way to get your results is to activate your My Chart account. Instructions are located on the last page of this paperwork. If you have not heard from us regarding the results in 2 weeks, please contact this office.     Diabetes Mellitus and Nutrition, Adult When you have diabetes (diabetes mellitus), it is very important to have healthy eating habits because your blood sugar (glucose) levels are greatly affected by what you eat and drink. Eating healthy foods in the appropriate amounts, at about the same times every day, can help you:  Control your blood glucose.  Lower your risk of heart disease.  Improve your blood pressure.  Reach or maintain a healthy weight. Every person with diabetes is different, and each person has different needs for a meal plan. Your health care provider may recommend that you work with a diet and nutrition specialist (dietitian) to make a meal plan that is best for you. Your meal plan may vary depending on factors such as:  The calories you need.  The medicines you take.  Your weight.  Your blood glucose, blood pressure, and cholesterol levels.  Your activity level.  Other health conditions  you have, such as heart or kidney disease. How do carbohydrates affect me? Carbohydrates, also called carbs, affect your blood glucose level more than any other type of food. Eating carbs naturally raises the amount of glucose in your blood. Carb counting is a method for keeping track of how many carbs you eat. Counting carbs is important to keep your blood glucose at a healthy level, especially if you use insulin or take certain oral diabetes medicines. It is important to know how many carbs you can safely have in each meal. This is different for every person. Your dietitian can help you calculate how many carbs you should have at each meal and for each snack. Foods that contain carbs include:  Bread, cereal, rice, pasta, and crackers.  Potatoes and corn.  Peas, beans, and lentils.  Milk and yogurt.  Fruit and juice.  Desserts, such as cakes, cookies, ice cream, and candy. How does alcohol affect me? Alcohol can cause a sudden decrease in blood glucose (hypoglycemia), especially if you use insulin or take certain oral diabetes medicines. Hypoglycemia can be a life-threatening condition. Symptoms of hypoglycemia (sleepiness, dizziness, and confusion) are similar to symptoms of having too much alcohol. If your health care provider says that alcohol is safe for you, follow these guidelines:  Limit alcohol intake to no more than 1 drink per day for nonpregnant women and 2 drinks per day for men. One drink equals 12 oz of beer, 5 oz of wine, or 1 oz of hard liquor.    Do not drink on an empty stomach.  Keep yourself hydrated with water, diet soda, or unsweetened iced tea.  Keep in mind that regular soda, juice, and other mixers may contain a lot of sugar and must be counted as carbs. What are tips for following this plan?  Reading food labels  Start by checking the serving size on the "Nutrition Facts" label of packaged foods and drinks. The amount of calories, carbs, fats, and other  nutrients listed on the label is based on one serving of the item. Many items contain more than one serving per package.  Check the total grams (g) of carbs in one serving. You can calculate the number of servings of carbs in one serving by dividing the total carbs by 15. For example, if a food has 30 g of total carbs, it would be equal to 2 servings of carbs.  Check the number of grams (g) of saturated and trans fats in one serving. Choose foods that have low or no amount of these fats.  Check the number of milligrams (mg) of salt (sodium) in one serving. Most people should limit total sodium intake to less than 2,300 mg per day.  Always check the nutrition information of foods labeled as "low-fat" or "nonfat". These foods may be higher in added sugar or refined carbs and should be avoided.  Talk to your dietitian to identify your daily goals for nutrients listed on the label. Shopping  Avoid buying canned, premade, or processed foods. These foods tend to be high in fat, sodium, and added sugar.  Shop around the outside edge of the grocery store. This includes fresh fruits and vegetables, bulk grains, fresh meats, and fresh dairy. Cooking  Use low-heat cooking methods, such as baking, instead of high-heat cooking methods like deep frying.  Cook using healthy oils, such as olive, canola, or sunflower oil.  Avoid cooking with butter, cream, or high-fat meats. Meal planning  Eat meals and snacks regularly, preferably at the same times every day. Avoid going long periods of time without eating.  Eat foods high in fiber, such as fresh fruits, vegetables, beans, and whole grains. Talk to your dietitian about how many servings of carbs you can eat at each meal.  Eat 4-6 ounces (oz) of lean protein each day, such as lean meat, chicken, fish, eggs, or tofu. One oz of lean protein is equal to: ? 1 oz of meat, chicken, or fish. ? 1 egg. ?  cup of tofu.  Eat some foods each day that contain  healthy fats, such as avocado, nuts, seeds, and fish. Lifestyle  Check your blood glucose regularly.  Exercise regularly as told by your health care provider. This may include: ? 150 minutes of moderate-intensity or vigorous-intensity exercise each week. This could be brisk walking, biking, or water aerobics. ? Stretching and doing strength exercises, such as yoga or weightlifting, at least 2 times a week.  Take medicines as told by your health care provider.  Do not use any products that contain nicotine or tobacco, such as cigarettes and e-cigarettes. If you need help quitting, ask your health care provider.  Work with a counselor or diabetes educator to identify strategies to manage stress and any emotional and social challenges. Questions to ask a health care provider  Do I need to meet with a diabetes educator?  Do I need to meet with a dietitian?  What number can I call if I have questions?  When are the best times to   check my blood glucose? Where to find more information:  American Diabetes Association: diabetes.org  Academy of Nutrition and Dietetics: www.eatright.org  National Institute of Diabetes and Digestive and Kidney Diseases (NIH): www.niddk.nih.gov Summary  A healthy meal plan will help you control your blood glucose and maintain a healthy lifestyle.  Working with a diet and nutrition specialist (dietitian) can help you make a meal plan that is best for you.  Keep in mind that carbohydrates (carbs) and alcohol have immediate effects on your blood glucose levels. It is important to count carbs and to use alcohol carefully. This information is not intended to replace advice given to you by your health care provider. Make sure you discuss any questions you have with your health care provider. Document Released: 06/09/2005 Document Revised: 08/25/2017 Document Reviewed: 10/17/2016 Elsevier Patient Education  2020 Elsevier Inc.  

## 2019-04-11 NOTE — Telephone Encounter (Signed)
Called pt and he will come in today for Dm f/u

## 2019-04-11 NOTE — Progress Notes (Signed)
Lab Results  Component Value Date   HGBA1C 8.1 (A) 10/16/2018   Lab Results  Component Value Date   CHOL 180 10/16/2018   HDL 38 (L) 10/16/2018   LDLCALC 127 (H) 10/16/2018   TRIG 75 10/16/2018   CHOLHDL 4.7 10/16/2018   Oscar Fernandez 43 y.o.   Chief Complaint  Patient presents with   Diabetes    f/u   Medication Management    wants to change DM med    HISTORY OF PRESENT ILLNESS: This is a 43 y.o. male with history of diabetes here for follow-up and medication change.  Medical insurance will no longer cover for InvokaMet.  Sugars have been high at home.  Compliant with medication.  HPI   Prior to Admission medications   Medication Sig Start Date End Date Taking? Authorizing Provider  aspirin EC 81 MG tablet Take 1 tablet (81 mg total) by mouth daily. 04/18/14  Yes Oscar Knapp, MD  INVOKAMET XR 50-1000 MG TB24  11/15/18  Yes [provider]  pravastatin (PRAVACHOL) 40 MG tablet Take 1 tablet (40 mg total) by mouth daily. 10/16/18  Yes Oscar Fernandez, Oscar Bloomer, MD  acetaminophen (TYLENOL) 500 MG tablet Take 1,000 mg by mouth every 6 (six) hours as needed for mild pain. Reported on 10/29/2015    [provider]  loperamide (IMODIUM) 2 MG capsule Take 1 capsule (2 mg total) by mouth 2 (two) times daily as needed for diarrhea or loose stools. Patient not taking: Reported on 04/11/2019 07/25/16   Larene Pickett, PA-C    No Known Allergies  Patient Active Problem List   Diagnosis Date Noted   Type 2 diabetes mellitus ([place]) 04/19/2014   Hyperlipidemia LDL goal <100 04/19/2014    Past Medical History:  Diagnosis Date   Diabetes mellitus without complication ([place])    Hyperlipidemia     History reviewed. No pertinent surgical history.  Social History   Socioeconomic History   Marital status: Married    Spouse name: Social research officer, government   Number of children: 4   Years of education: college   Highest education level: Not on file  Occupational History    Occupation: Chief Strategy Officer strain: Not on file   Food insecurity    Worry: Not on file    Inability: Not on file   Transportation needs    Medical: Not on file    Non-medical: Not on file  Tobacco Use   Smoking status: Never Smoker   Smokeless tobacco: Never Used  Substance and Sexual Activity   Alcohol use: No   Drug use: No   Sexual activity: Yes  Lifestyle   Physical activity    Days per week: Not on file    Minutes per session: Not on file   Stress: Not on file  Relationships   Social connections    Talks on phone: Not on file    Gets together: Not on file    Attends religious service: Not on file    Active member of club or organization: Not on file    Attends meetings of clubs or organizations: Not on file    Relationship status: Not on file   Intimate partner violence    Fear of current or ex partner: Not on file    Emotionally abused: Not on file    Physically abused: Not on file    Forced sexual activity: Not on file  Other Topics Concern   Not on file  Social History Narrative   Not on file    History reviewed. No pertinent family history.   Review of Systems  Constitutional: Negative.  Negative for fever, malaise/fatigue and weight loss.  HENT: Negative.  Negative for sore throat.   Eyes: Negative.   Respiratory: Negative.  Negative for cough and shortness of breath.   Cardiovascular: Negative.  Negative for chest pain and palpitations.  Gastrointestinal: Negative.  Negative for abdominal pain, diarrhea, nausea and vomiting.  Genitourinary: Negative.   Musculoskeletal: Negative.   Skin: Negative.  Negative for rash.  Neurological: Negative.  Negative for dizziness and headaches.  All other systems reviewed and are negative.  Vitals:   04/11/19 1617  BP: 130/82  Pulse: 89  Temp: 98.5 F (36.9 C)  SpO2: 95%     Physical Exam Vitals signs reviewed.  Constitutional:      Appearance: Normal  appearance.  HENT:     Head: Normocephalic and atraumatic.  Eyes:     Extraocular Movements: Extraocular movements intact.     Conjunctiva/sclera: Conjunctivae normal.     Pupils: Pupils are equal, round, and reactive to light.  Neck:     Musculoskeletal: Normal range of motion and neck supple.  Cardiovascular:     Rate and Rhythm: Normal rate and regular rhythm.     Heart sounds: Normal heart sounds.  Pulmonary:     Effort: Pulmonary effort is normal.     Breath sounds: Normal breath sounds.  Abdominal:     Palpations: Abdomen is soft.     Tenderness: There is no abdominal tenderness.  Musculoskeletal: Normal range of motion.  Skin:    General: Skin is warm and dry.     Capillary Refill: Capillary refill takes less than 2 seconds.  Neurological:     General: No focal deficit present.     Mental Status: He is alert and oriented to person, place, and time.  Psychiatric:        Mood and Affect: Mood normal.        Behavior: Behavior normal.      ASSESSMENT & PLAN: Type 2 diabetes mellitus Uncontrolled diabetes.  Insurance will no longer cover for invokamet.  Will start metformin 6283 g twice and Trulicity 1.51 mg weekly.  Importance of diet and exercise discussed.  Follow-up in 3 months.  Oscar Fernandez was seen today for diabetes and medication management.  Diagnoses and all orders for this visit:  Dyslipidemia associated with type 2 diabetes mellitus ([place])  Type 2 diabetes mellitus with hyperglycemia, without long-term current use of insulin ([facility]) -     CMP14+EGFR -     Lipid panel -     Hemoglobin A1c -     metFORMIN (GLUCOPHAGE) 1000 MG tablet; Take 1 tablet (1,000 mg total) by mouth 2 (two) times daily with a meal. -     Dulaglutide (TRULICITY) 7.61 YW/7.3XT SOPN; Inject 0.75 mg into the skin once a week.  Dyslipidemia    Patient Instructions       If you have lab work done today you will be contacted with your lab results within the next 2 weeks.  If you have  not heard from [place] then please contact us. The fastest way to get your results is to register for My Chart.   IF you received an x-ray today, you will receive an invoice from [facility]. Please contact [facility] at [phone] with questions or concerns regarding your invoice.   IF you received labwork today, you will  receive an invoice from [facility]. Please contact [facility] at [phone] with questions or concerns regarding your invoice.   Our billing staff will not be able to assist you with questions regarding bills from these companies.  You will be contacted with the lab results as soon as they are available. The fastest way to get your results is to activate your My Chart account. Instructions are located on the last page of this paperwork. If you have not heard from [place] regarding the results in 2 weeks, please contact this office.      Diabetes Mellitus and Nutrition, Adult When you have diabetes (diabetes mellitus), it is very important to have healthy eating habits because your blood sugar (glucose) levels are greatly affected by what you eat and drink. Eating healthy foods in the appropriate amounts, at about the same times every day, can help you:  Control your blood glucose.  Lower your risk of heart disease.  Improve your blood pressure.  Reach or maintain a healthy weight. Every person with diabetes is different, and each person has different needs for a meal plan. Your health care provider may recommend that you work with a diet and nutrition specialist (dietitian) to make a meal plan that is best for you. Your meal plan may vary depending on factors such as:  The calories you need.  The medicines you take.  Your weight.  Your blood glucose, blood pressure, and cholesterol levels.  Your activity level.  Other health conditions you have, such as heart or kidney disease. How do carbohydrates affect me? Carbohydrates, also called carbs, affect  your blood glucose level more than any other type of food. Eating carbs naturally raises the amount of glucose in your blood. Carb counting is a method for keeping track of how many carbs you eat. Counting carbs is important to keep your blood glucose at a healthy level, especially if you use insulin or take certain oral diabetes medicines. It is important to know how many carbs you can safely have in each meal. This is different for every person. Your dietitian can help you calculate how many carbs you should have at each meal and for each snack. Foods that contain carbs include:  Bread, cereal, rice, pasta, and crackers.  Potatoes and corn.  Peas, beans, and lentils.  Milk and yogurt.  Fruit and juice.  Desserts, such as cakes, cookies, ice cream, and candy. How does alcohol affect me? Alcohol can cause a sudden decrease in blood glucose (hypoglycemia), especially if you use insulin or take certain oral diabetes medicines. Hypoglycemia can be a life-threatening condition. Symptoms of hypoglycemia (sleepiness, dizziness, and confusion) are similar to symptoms of having too much alcohol. If your health care provider says that alcohol is safe for you, follow these guidelines:  Limit alcohol intake to no more than 1 drink per day for nonpregnant women and 2 drinks per day for men. One drink equals 12 oz of beer, 5 oz of wine, or 1 oz of hard liquor.  Do not drink on an empty stomach.  Keep yourself hydrated with water, diet soda, or unsweetened iced tea.  Keep in mind that regular soda, juice, and other mixers may contain a lot of sugar and must be counted as carbs. What are tips for following this plan?  Reading food labels  Start by checking the serving size on the "Nutrition Facts" label of packaged foods and drinks. The amount of calories, carbs, fats, and other nutrients listed on the label is  based on one serving of the item. Many items contain more than one serving per  package.  Check the total grams (g) of carbs in one serving. You can calculate the number of servings of carbs in one serving by dividing the total carbs by 15. For example, if a food has 30 g of total carbs, it would be equal to 2 servings of carbs.  Check the number of grams (g) of saturated and trans fats in one serving. Choose foods that have low or no amount of these fats.  Check the number of milligrams (mg) of salt (sodium) in one serving. Most people should limit total sodium intake to less than 2,300 mg per day.  Always check the nutrition information of foods labeled as "low-fat" or "nonfat". These foods may be higher in added sugar or refined carbs and should be avoided.  Talk to your dietitian to identify your daily goals for nutrients listed on the label. Shopping  Avoid buying canned, premade, or processed foods. These foods tend to be high in fat, sodium, and added sugar.  Shop around the outside edge of the grocery store. This includes fresh fruits and vegetables, bulk grains, fresh meats, and fresh dairy. Cooking  Use low-heat cooking methods, such as baking, instead of high-heat cooking methods like deep frying.  Cook using healthy oils, such as olive, canola, or sunflower oil.  Avoid cooking with butter, cream, or high-fat meats. Meal planning  Eat meals and snacks regularly, preferably at the same times every day. Avoid going long periods of time without eating.  Eat foods high in fiber, such as fresh fruits, vegetables, beans, and whole grains. Talk to your dietitian about how many servings of carbs you can eat at each meal.  Eat 4-6 ounces (oz) of lean protein each day, such as lean meat, chicken, fish, eggs, or tofu. One oz of lean protein is equal to: ? 1 oz of meat, chicken, or fish. ? 1 egg. ?  cup of tofu.  Eat some foods each day that contain healthy fats, such as avocado, nuts, seeds, and fish. Lifestyle  Check your blood glucose  regularly.  Exercise regularly as told by your health care provider. This may include: ? 150 minutes of moderate-intensity or vigorous-intensity exercise each week. This could be brisk walking, biking, or water aerobics. ? Stretching and doing strength exercises, such as yoga or weightlifting, at least 2 times a week.  Take medicines as told by your health care provider.  Do not use any products that contain nicotine or tobacco, such as cigarettes and e-cigarettes. If you need help quitting, ask your health care provider.  Work with a Social worker or diabetes educator to identify strategies to manage stress and any emotional and social challenges. Questions to ask a health care provider  Do I need to meet with a diabetes educator?  Do I need to meet with a dietitian?  What number can I call if I have questions?  When are the best times to check my blood glucose? Where to find more information:  American Diabetes Association: diabetes.org  Academy of Nutrition and Dietetics: www.eatright.[employer] of Diabetes and Digestive and Kidney Diseases (NIH): [employer] Summary  A healthy meal plan will help you control your blood glucose and maintain a healthy lifestyle.  Working with a diet and nutrition specialist (dietitian) can help you make a meal plan that is best for you.  Keep in mind that carbohydrates (carbs) and alcohol have immediate effects  on your blood glucose levels. It is important to count carbs and to use alcohol carefully. This information is not intended to replace advice given to you by your health care provider. Make sure you discuss any questions you have with your health care provider. Document Released: 06/09/2005 Document Revised: 08/25/2017 Document Reviewed: 10/17/2016 Elsevier Patient Education  2020 Elsevier Inc.      Agustina Caroli, MD [facility]

## 2019-04-12 LAB — CMP14+EGFR
ALT: 19 IU/L (ref 0–44)
AST: 17 IU/L (ref 0–40)
Albumin/Globulin Ratio: 2 (ref 1.2–2.2)
Albumin: 4.4 g/dL (ref 4.0–5.0)
Alkaline Phosphatase: 71 IU/L (ref 39–117)
BUN/Creatinine Ratio: 26 — ABNORMAL HIGH (ref 9–20)
BUN: 19 mg/dL (ref 6–24)
Bilirubin Total: 0.4 mg/dL (ref 0.0–1.2)
CO2: 20 mmol/L (ref 20–29)
Calcium: 9.3 mg/dL (ref 8.7–10.2)
Chloride: 106 mmol/L (ref 96–106)
Creatinine, Ser: 0.73 mg/dL — ABNORMAL LOW (ref 0.76–1.27)
GFR calc Af Amer: 131 mL/min/{1.73_m2} (ref >59)
GFR calc non Af Amer: 114 mL/min/{1.73_m2} (ref >59)
Globulin, Total: 2.2 g/dL (ref 1.5–4.5)
Glucose: 197 mg/dL — ABNORMAL HIGH (ref 65–99)
Potassium: 4.3 mmol/L (ref 3.5–5.2)
Sodium: 142 mmol/L (ref 134–144)
Total Protein: 6.6 g/dL (ref 6.0–8.5)

## 2019-04-12 LAB — LIPID PANEL
Chol/HDL Ratio: 5 ratio (ref 0.0–5.0)
Cholesterol, Total: 203 mg/dL — ABNORMAL HIGH (ref 100–199)
HDL: 41 mg/dL (ref >39)
LDL Calculated: 116 mg/dL — ABNORMAL HIGH (ref 0–99)
Triglycerides: 230 mg/dL — ABNORMAL HIGH (ref 0–149)
VLDL Cholesterol Cal: 46 mg/dL — ABNORMAL HIGH (ref 5–40)

## 2019-04-12 LAB — HEMOGLOBIN A1C
Est. average glucose Bld gHb Est-mCnc: 197 mg/dL
Hgb A1c MFr Bld: 8.5 % — ABNORMAL HIGH (ref 4.8–5.6)

## 2019-04-13 ENCOUNTER — Encounter: Payer: Self-pay | Admitting: Emergency Medicine

## 2019-07-18 ENCOUNTER — Other Ambulatory Visit: Payer: Self-pay

## 2019-07-18 ENCOUNTER — Ambulatory Visit (INDEPENDENT_AMBULATORY_CARE_PROVIDER_SITE_OTHER): Payer: BC Managed Care – PPO | Admitting: Emergency Medicine

## 2019-07-18 ENCOUNTER — Encounter: Payer: Self-pay | Admitting: Emergency Medicine

## 2019-07-18 VITALS — BP 127/84 | HR 88 | Temp 98.5°F | Resp 16 | Ht 67.0 in | Wt 194.4 lb

## 2019-07-18 DIAGNOSIS — E1165 Type 2 diabetes mellitus with hyperglycemia: Secondary | ICD-10-CM

## 2019-07-18 DIAGNOSIS — E1169 Type 2 diabetes mellitus with other specified complication: Secondary | ICD-10-CM | POA: Diagnosis not present

## 2019-07-18 DIAGNOSIS — Z23 Encounter for immunization: Secondary | ICD-10-CM

## 2019-07-18 DIAGNOSIS — E785 Hyperlipidemia, unspecified: Secondary | ICD-10-CM | POA: Diagnosis not present

## 2019-07-18 LAB — COMPREHENSIVE METABOLIC PANEL
ALT: 21 IU/L (ref 0–44)
AST: 23 IU/L (ref 0–40)
Albumin/Globulin Ratio: 1.9 (ref 1.2–2.2)
Albumin: 4.4 g/dL (ref 4.0–5.0)
Alkaline Phosphatase: 68 IU/L (ref 39–117)
BUN/Creatinine Ratio: 32 — ABNORMAL HIGH (ref 9–20)
BUN: 24 mg/dL (ref 6–24)
Bilirubin Total: 0.8 mg/dL (ref 0.0–1.2)
CO2: 22 mmol/L (ref 20–29)
Calcium: 8.9 mg/dL (ref 8.7–10.2)
Chloride: 105 mmol/L (ref 96–106)
Creatinine, Ser: 0.74 mg/dL — ABNORMAL LOW (ref 0.76–1.27)
GFR calc Af Amer: 131 mL/min/{1.73_m2} (ref >59)
GFR calc non Af Amer: 113 mL/min/{1.73_m2} (ref >59)
Globulin, Total: 2.3 g/dL (ref 1.5–4.5)
Glucose: 137 mg/dL — ABNORMAL HIGH (ref 65–99)
Potassium: 4.5 mmol/L (ref 3.5–5.2)
Sodium: 140 mmol/L (ref 134–144)
Total Protein: 6.7 g/dL (ref 6.0–8.5)

## 2019-07-18 LAB — POCT GLYCOSYLATED HEMOGLOBIN (HGB A1C): Hemoglobin A1C: 7.3 % — AB (ref 4.0–5.6)

## 2019-07-18 LAB — LIPID PANEL
Chol/HDL Ratio: 3.6 ratio (ref 0.0–5.0)
Cholesterol, Total: 118 mg/dL (ref 100–199)
HDL: 33 mg/dL — ABNORMAL LOW (ref >39)
LDL Chol Calc (NIH): 66 mg/dL (ref 0–99)
Triglycerides: 99 mg/dL (ref 0–149)
VLDL Cholesterol Cal: 19 mg/dL (ref 5–40)

## 2019-07-18 LAB — GLUCOSE, POCT (MANUAL RESULT ENTRY): POC Glucose: 137 mg/dl — AB (ref 70–99)

## 2019-07-18 MED ORDER — ROSUVASTATIN CALCIUM 20 MG PO TABS: 20.0000 mg | ORAL_TABLET | Freq: Every day | ORAL | 3 refills | Status: DC

## 2019-07-18 MED ORDER — METFORMIN HCL 1000 MG PO TABS: 1000.0000 mg | ORAL_TABLET | Freq: Two times a day (BID) | ORAL | 3 refills | Status: DC

## 2019-07-18 MED ORDER — TRULICITY 0.75 MG/0.5ML ~~LOC~~ SOAJ: 0.7500 mg | SUBCUTANEOUS | 5 refills | Status: DC

## 2019-07-18 NOTE — Assessment & Plan Note (Signed)
Well-controlled diabetes with hemoglobin A1c of 7.3.  Continue present medications.  No changes.  Diet and nutrition discussed with patient.  Follow-up in 6 months.

## 2019-07-18 NOTE — Patient Instructions (Addendum)
   If you have lab work done today you will be contacted with your lab results within the next 2 weeks.  If you have not heard from us then please contact us. The fastest way to get your results is to register for My Chart.   IF you received an x-ray today, you will receive an invoice from [facility]. Please contact [facility] at [phone] with questions or concerns regarding your invoice.   IF you received labwork today, you will receive an invoice from [facility]. Please contact [facility] at [phone] with questions or concerns regarding your invoice.   Our billing staff will not be able to assist you with questions regarding bills from these companies.  You will be contacted with the lab results as soon as they are available. The fastest way to get your results is to activate your My Chart account. Instructions are located on the last page of this paperwork. If you have not heard from us regarding the results in 2 weeks, please contact this office.     Diabetes Mellitus and Nutrition, Adult When you have diabetes (diabetes mellitus), it is very important to have healthy eating habits because your blood sugar (glucose) levels are greatly affected by what you eat and drink. Eating healthy foods in the appropriate amounts, at about the same times every day, can help you:  Control your blood glucose.  Lower your risk of heart disease.  Improve your blood pressure.  Reach or maintain a healthy weight. Every person with diabetes is different, and each person has different needs for a meal plan. Your health care provider may recommend that you work with a diet and nutrition specialist (dietitian) to make a meal plan that is best for you. Your meal plan may vary depending on factors such as:  The calories you need.  The medicines you take.  Your weight.  Your blood glucose, blood pressure, and cholesterol levels.  Your activity level.  Other health conditions  you have, such as heart or kidney disease. How do carbohydrates affect me? Carbohydrates, also called carbs, affect your blood glucose level more than any other type of food. Eating carbs naturally raises the amount of glucose in your blood. Carb counting is a method for keeping track of how many carbs you eat. Counting carbs is important to keep your blood glucose at a healthy level, especially if you use insulin or take certain oral diabetes medicines. It is important to know how many carbs you can safely have in each meal. This is different for every person. Your dietitian can help you calculate how many carbs you should have at each meal and for each snack. Foods that contain carbs include:  Bread, cereal, rice, pasta, and crackers.  Potatoes and corn.  Peas, beans, and lentils.  Milk and yogurt.  Fruit and juice.  Desserts, such as cakes, cookies, ice cream, and candy. How does alcohol affect me? Alcohol can cause a sudden decrease in blood glucose (hypoglycemia), especially if you use insulin or take certain oral diabetes medicines. Hypoglycemia can be a life-threatening condition. Symptoms of hypoglycemia (sleepiness, dizziness, and confusion) are similar to symptoms of having too much alcohol. If your health care provider says that alcohol is safe for you, follow these guidelines:  Limit alcohol intake to no more than 1 drink per day for nonpregnant women and 2 drinks per day for men. One drink equals 12 oz of beer, 5 oz of wine, or 1 oz of hard liquor.    Do not drink on an empty stomach.  Keep yourself hydrated with water, diet soda, or unsweetened iced tea.  Keep in mind that regular soda, juice, and other mixers may contain a lot of sugar and must be counted as carbs. What are tips for following this plan?  Reading food labels  Start by checking the serving size on the "Nutrition Facts" label of packaged foods and drinks. The amount of calories, carbs, fats, and other  nutrients listed on the label is based on one serving of the item. Many items contain more than one serving per package.  Check the total grams (g) of carbs in one serving. You can calculate the number of servings of carbs in one serving by dividing the total carbs by 15. For example, if a food has 30 g of total carbs, it would be equal to 2 servings of carbs.  Check the number of grams (g) of saturated and trans fats in one serving. Choose foods that have low or no amount of these fats.  Check the number of milligrams (mg) of salt (sodium) in one serving. Most people should limit total sodium intake to less than 2,300 mg per day.  Always check the nutrition information of foods labeled as "low-fat" or "nonfat". These foods may be higher in added sugar or refined carbs and should be avoided.  Talk to your dietitian to identify your daily goals for nutrients listed on the label. Shopping  Avoid buying canned, premade, or processed foods. These foods tend to be high in fat, sodium, and added sugar.  Shop around the outside edge of the grocery store. This includes fresh fruits and vegetables, bulk grains, fresh meats, and fresh dairy. Cooking  Use low-heat cooking methods, such as baking, instead of high-heat cooking methods like deep frying.  Cook using healthy oils, such as olive, canola, or sunflower oil.  Avoid cooking with butter, cream, or high-fat meats. Meal planning  Eat meals and snacks regularly, preferably at the same times every day. Avoid going long periods of time without eating.  Eat foods high in fiber, such as fresh fruits, vegetables, beans, and whole grains. Talk to your dietitian about how many servings of carbs you can eat at each meal.  Eat 4-6 ounces (oz) of lean protein each day, such as lean meat, chicken, fish, eggs, or tofu. One oz of lean protein is equal to: ? 1 oz of meat, chicken, or fish. ? 1 egg. ?  cup of tofu.  Eat some foods each day that contain  healthy fats, such as avocado, nuts, seeds, and fish. Lifestyle  Check your blood glucose regularly.  Exercise regularly as told by your health care provider. This may include: ? 150 minutes of moderate-intensity or vigorous-intensity exercise each week. This could be brisk walking, biking, or water aerobics. ? Stretching and doing strength exercises, such as yoga or weightlifting, at least 2 times a week.  Take medicines as told by your health care provider.  Do not use any products that contain nicotine or tobacco, such as cigarettes and e-cigarettes. If you need help quitting, ask your health care provider.  Work with a counselor or diabetes educator to identify strategies to manage stress and any emotional and social challenges. Questions to ask a health care provider  Do I need to meet with a diabetes educator?  Do I need to meet with a dietitian?  What number can I call if I have questions?  When are the best times to   check my blood glucose? Where to find more information:  American Diabetes Association: diabetes.org  Academy of Nutrition and Dietetics: www.eatright.org  National Institute of Diabetes and Digestive and Kidney Diseases (NIH): www.niddk.nih.gov Summary  A healthy meal plan will help you control your blood glucose and maintain a healthy lifestyle.  Working with a diet and nutrition specialist (dietitian) can help you make a meal plan that is best for you.  Keep in mind that carbohydrates (carbs) and alcohol have immediate effects on your blood glucose levels. It is important to count carbs and to use alcohol carefully. This information is not intended to replace advice given to you by your health care provider. Make sure you discuss any questions you have with your health care provider. Document Released: 06/09/2005 Document Revised: 08/25/2017 Document Reviewed: 10/17/2016 Elsevier Patient Education  2020 Elsevier Inc.  

## 2019-07-18 NOTE — Progress Notes (Signed)
Lab Results  Component Value Date   HGBA1C 8.5 (H) 04/11/2019   Lab Results  Component Value Date   CHOL 203 (H) 04/11/2019   HDL 41 04/11/2019   LDLCALC 116 (H) 04/11/2019   TRIG 230 (H) 04/11/2019   CHOLHDL 5.0 04/11/2019   Lab Results  Component Value Date   CREATININE 0.73 (L) 04/11/2019   BUN 19 04/11/2019   NA 142 04/11/2019   K 4.3 04/11/2019   CL 106 04/11/2019   CO2 20 04/11/2019   BP Readings from Last 3 Encounters:  04/11/19 130/82  10/16/18 111/74  06/20/18 116/70   Oscar Fernandez 43 y.o.   No chief complaint on file.   HISTORY OF PRESENT ILLNESS: This is a 43 y.o. male with history of diabetes and dyslipidemia here for follow-up on medication refill. 1.  Diabetes type 2: On metformin 1000 mg twice a day and Trulicity 0.75 mg weekly.  Working well no complaints.  Tolerating medications well.  Morning sugars 130s to 140s. 2.  Dyslipidemia: On pravastatin.  Will change to Crestor 20 mg daily. Has no complaints or medical concerns today.  HPI   Prior to Admission medications   Medication Sig Start Date End Date Taking? Authorizing Provider  acetaminophen (TYLENOL) 500 MG tablet Take 1,000 mg by mouth every 6 (six) hours as needed for mild pain. Reported on 10/29/2015    [provider]  aspirin EC 81 MG tablet Take 1 tablet (81 mg total) by mouth daily. 04/18/14   Sherren MochaShaw, Eva N, MD  Dulaglutide (TRULICITY) 0.75 MG/0.5ML SOPN Inject 0.75 mg into the skin once a week. 04/11/19   Georgina QuintSagardia, Ambre Kobayashi Jose, MD  loperamide (IMODIUM) 2 MG capsule Take 1 capsule (2 mg total) by mouth 2 (two) times daily as needed for diarrhea or loose stools. Patient not taking: Reported on 04/11/2019 07/25/16   Garlon HatchetSanders, Lisa M, PA-C  metFORMIN (GLUCOPHAGE) 1000 MG tablet Take 1 tablet (1,000 mg total) by mouth 2 (two) times daily with a meal. 04/11/19   Rosaland Shiffman, Eilleen KempfMiguel Jose, MD  rosuvastatin (CRESTOR) 20 MG tablet Take 1 tablet (20 mg total) by mouth daily. 07/18/19   Georgina QuintSagardia,  Zayda Angell Jose, MD    No Known Allergies  Patient Active Problem List   Diagnosis Date Noted  . Type 2 diabetes mellitus (HCC) 04/19/2014  . Hyperlipidemia LDL goal <100 04/19/2014    Past Medical History:  Diagnosis Date  . Diabetes mellitus without complication (HCC)   . Hyperlipidemia     No past surgical history on file.  Social History   Socioeconomic History  . Marital status: Married    Spouse name: Building surveyorMoutalahatou  . Number of children: 4  . Years of education: college  . Highest education level: Not on file  Occupational History  . Occupation: Chiropodistindustrial  Social Needs  . Financial resource strain: Not on file  . Food insecurity    Worry: Not on file    Inability: Not on file  . Transportation needs    Medical: Not on file    Non-medical: Not on file  Tobacco Use  . Smoking status: Never Smoker  . Smokeless tobacco: Never Used  Substance and Sexual Activity  . Alcohol use: No  . Drug use: No  . Sexual activity: Yes  Lifestyle  . Physical activity    Days per week: Not on file    Minutes per session: Not on file  . Stress: Not on file  Relationships  . Social connections  Talks on phone: Not on file    Gets together: Not on file    Attends religious service: Not on file    Active member of club or organization: Not on file    Attends meetings of clubs or organizations: Not on file    Relationship status: Not on file  . Intimate partner violence    Fear of current or ex partner: Not on file    Emotionally abused: Not on file    Physically abused: Not on file    Forced sexual activity: Not on file  Other Topics Concern  . Not on file  Social History Narrative  . Not on file    No family history on file.   Review of Systems  Constitutional: Negative.  Negative for fever.  HENT: Negative.  Negative for congestion and sore throat.   Respiratory: Negative.  Negative for cough and shortness of breath.   Cardiovascular: Negative.  Negative for  chest pain and palpitations.  Gastrointestinal: Negative.  Negative for abdominal pain, blood in stool, diarrhea, nausea and vomiting.  Genitourinary: Negative.  Negative for dysuria and hematuria.  Musculoskeletal: Negative.  Negative for myalgias.  Skin: Negative.  Negative for rash.  Neurological: Negative.  Negative for dizziness and headaches.  Endo/Heme/Allergies: Negative.   All other systems reviewed and are negative.  Vitals:   07/18/19 0810  BP: 127/84  Pulse: 88  Resp: 16  Temp: 98.5 F (36.9 C)  SpO2: 99%     Physical Exam Vitals signs reviewed.  Constitutional:      Appearance: Normal appearance.  HENT:     Head: Normocephalic.  Eyes:     Extraocular Movements: Extraocular movements intact.     Conjunctiva/sclera: Conjunctivae normal.     Pupils: Pupils are equal, round, and reactive to light.  Neck:     Musculoskeletal: Normal range of motion and neck supple.  Cardiovascular:     Rate and Rhythm: Normal rate and regular rhythm.     Pulses: Normal pulses.     Heart sounds: Normal heart sounds.  Pulmonary:     Effort: Pulmonary effort is normal.     Breath sounds: Normal breath sounds.  Abdominal:     Palpations: Abdomen is soft.     Tenderness: There is no abdominal tenderness.  Musculoskeletal: Normal range of motion.  Skin:    General: Skin is warm and dry.     Capillary Refill: Capillary refill takes less than 2 seconds.  Neurological:     General: No focal deficit present.     Mental Status: He is alert and oriented to person, place, and time.  Psychiatric:        Mood and Affect: Mood normal.        Behavior: Behavior normal.      ASSESSMENT & PLAN: Type 2 diabetes mellitus Well-controlled diabetes with hemoglobin A1c of 7.3.  Continue present medications.  No changes.  Diet and nutrition discussed with patient.  Follow-up in 6 months.   Oscar Fernandez was seen today for diabetes and medication refill.  Diagnoses and all orders for this  visit:  Dyslipidemia associated with type 2 diabetes mellitus ([place]) -     rosuvastatin (CRESTOR) 20 MG tablet; Take 1 tablet (20 mg total) by mouth daily. -     Comprehensive metabolic panel -     Lipid panel -     POCT glucose (manual entry) -     POCT glycosylated hemoglobin (Hb A1C) -  metFORMIN (GLUCOPHAGE) 1000 MG tablet; Take 1 tablet (1,000 mg total) by mouth 2 (two) times daily with a meal. -     Dulaglutide (TRULICITY) 0.75 MG/0.5ML SOPN; Inject 0.75 mg into the skin once a week.  Need for prophylactic vaccination and inoculation against influenza -     Flu Vaccine QUAD 36+ mos IM  Type 2 diabetes mellitus with hyperglycemia, without long-term current use of insulin (HCC)      Edwina Barth, MD [facility]

## 2019-07-20 ENCOUNTER — Encounter: Payer: Self-pay | Admitting: Emergency Medicine

## 2020-01-15 ENCOUNTER — Ambulatory Visit: Payer: BC Managed Care – PPO | Admitting: Emergency Medicine

## 2020-02-03 ENCOUNTER — Encounter: Payer: Self-pay | Admitting: Emergency Medicine

## 2020-02-03 ENCOUNTER — Other Ambulatory Visit: Payer: Self-pay

## 2020-02-03 ENCOUNTER — Ambulatory Visit (INDEPENDENT_AMBULATORY_CARE_PROVIDER_SITE_OTHER): Payer: BC Managed Care – PPO | Admitting: Emergency Medicine

## 2020-02-03 VITALS — BP 118/79 | HR 74 | Temp 97.3°F | Resp 16 | Ht 67.0 in | Wt 192.6 lb

## 2020-02-03 DIAGNOSIS — E1165 Type 2 diabetes mellitus with hyperglycemia: Secondary | ICD-10-CM | POA: Diagnosis not present

## 2020-02-03 DIAGNOSIS — E785 Hyperlipidemia, unspecified: Secondary | ICD-10-CM

## 2020-02-03 DIAGNOSIS — E1169 Type 2 diabetes mellitus with other specified complication: Secondary | ICD-10-CM | POA: Diagnosis not present

## 2020-02-03 LAB — POCT GLYCOSYLATED HEMOGLOBIN (HGB A1C): Hemoglobin A1C: 8.5 % — AB (ref 4.0–5.6)

## 2020-02-03 LAB — GLUCOSE, POCT (MANUAL RESULT ENTRY): POC Glucose: 258 mg/dl — AB (ref 70–99)

## 2020-02-03 MED ORDER — METFORMIN HCL 1000 MG PO TABS: 1000.0000 mg | ORAL_TABLET | Freq: Two times a day (BID) | ORAL | 3 refills | Status: DC

## 2020-02-03 MED ORDER — TRULICITY 1.5 MG/0.5ML ~~LOC~~ SOAJ: 1.5000 mg | SUBCUTANEOUS | 5 refills | Status: DC

## 2020-02-03 NOTE — Patient Instructions (Addendum)
Increase Trulicity dose to 1.5 mg weekly.  May use 2 doses of 0.75 mg pens. Continue Metformin 1000 mg twice a day. Follow-up in 3 months.   Diabetes Mellitus and Nutrition, Adult When you have diabetes (diabetes mellitus), it is very important to have healthy eating habits because your blood sugar (glucose) levels are greatly affected by what you eat and drink. Eating healthy foods in the appropriate amounts, at about the same times every day, can help you:  Control your blood glucose.  Lower your risk of heart disease.  Improve your blood pressure.  Reach or maintain a healthy weight. Every person with diabetes is different, and each person has different needs for a meal plan. Your health care provider may recommend that you work with a diet and nutrition specialist (dietitian) to make a meal plan that is best for you. Your meal plan may vary depending on factors such as:  The calories you need.  The medicines you take.  Your weight.  Your blood glucose, blood pressure, and cholesterol levels.  Your activity level.  Other health conditions you have, such as heart or kidney disease. How do carbohydrates affect me? Carbohydrates, also called carbs, affect your blood glucose level more than any other type of food. Eating carbs naturally raises the amount of glucose in your blood. Carb counting is a method for keeping track of how many carbs you eat. Counting carbs is important to keep your blood glucose at a healthy level, especially if you use insulin or take certain oral diabetes medicines. It is important to know how many carbs you can safely have in each meal. This is different for every person. Your dietitian can help you calculate how many carbs you should have at each meal and for each snack. Foods that contain carbs include:  Bread, cereal, rice, pasta, and crackers.  Potatoes and corn.  Peas, beans, and lentils.  Milk and yogurt.  Fruit and juice.  Desserts, such  as cakes, cookies, ice cream, and candy. How does alcohol affect me? Alcohol can cause a sudden decrease in blood glucose (hypoglycemia), especially if you use insulin or take certain oral diabetes medicines. Hypoglycemia can be a life-threatening condition. Symptoms of hypoglycemia (sleepiness, dizziness, and confusion) are similar to symptoms of having too much alcohol. If your health care provider says that alcohol is safe for you, follow these guidelines:  Limit alcohol intake to no more than 1 drink per day for nonpregnant women and 2 drinks per day for men. One drink equals 12 oz of beer, 5 oz of wine, or 1 oz of hard liquor.  Do not drink on an empty stomach.  Keep yourself hydrated with water, diet soda, or unsweetened iced tea.  Keep in mind that regular soda, juice, and other mixers may contain a lot of sugar and must be counted as carbs. What are tips for following this plan?  Reading food labels  Start by checking the serving size on the "Nutrition Facts" label of packaged foods and drinks. The amount of calories, carbs, fats, and other nutrients listed on the label is based on one serving of the item. Many items contain more than one serving per package.  Check the total grams (g) of carbs in one serving. You can calculate the number of servings of carbs in one serving by dividing the total carbs by 15. For example, if a food has 30 g of total carbs, it would be equal to 2 servings of carbs.  Check  the number of grams (g) of saturated and trans fats in one serving. Choose foods that have low or no amount of these fats.  Check the number of milligrams (mg) of salt (sodium) in one serving. Most people should limit total sodium intake to less than 2,300 mg per day.  Always check the nutrition information of foods labeled as "low-fat" or "nonfat". These foods may be higher in added sugar or refined carbs and should be avoided.  Talk to your dietitian to identify your daily goals  for nutrients listed on the label. Shopping  Avoid buying canned, premade, or processed foods. These foods tend to be high in fat, sodium, and added sugar.  Shop around the outside edge of the grocery store. This includes fresh fruits and vegetables, bulk grains, fresh meats, and fresh dairy. Cooking  Use low-heat cooking methods, such as baking, instead of high-heat cooking methods like deep frying.  Cook using healthy oils, such as olive, canola, or sunflower oil.  Avoid cooking with butter, cream, or high-fat meats. Meal planning  Eat meals and snacks regularly, preferably at the same times every day. Avoid going long periods of time without eating.  Eat foods high in fiber, such as fresh fruits, vegetables, beans, and whole grains. Talk to your dietitian about how many servings of carbs you can eat at each meal.  Eat 4-6 ounces (oz) of lean protein each day, such as lean meat, chicken, fish, eggs, or tofu. One oz of lean protein is equal to: ? 1 oz of meat, chicken, or fish. ? 1 egg. ?  cup of tofu.  Eat some foods each day that contain healthy fats, such as avocado, nuts, seeds, and fish. Lifestyle  Check your blood glucose regularly.  Exercise regularly as told by your health care provider. This may include: ? 150 minutes of moderate-intensity or vigorous-intensity exercise each week. This could be brisk walking, biking, or water aerobics. ? Stretching and doing strength exercises, such as yoga or weightlifting, at least 2 times a week.  Take medicines as told by your health care provider.  Do not use any products that contain nicotine or tobacco, such as cigarettes and e-cigarettes. If you need help quitting, ask your health care provider.  Work with a Veterinary surgeon or diabetes educator to identify strategies to manage stress and any emotional and social challenges. Questions to ask a health care provider  Do I need to meet with a diabetes educator?  Do I need to meet  with a dietitian?  What number can I call if I have questions?  When are the best times to check my blood glucose? Where to find more information:  American Diabetes Association: diabetes.org  Academy of Nutrition and Dietetics: www.eatright.[employer] of Diabetes and Digestive and Kidney Diseases (NIH): CarFlippers.tn Summary  A healthy meal plan will help you control your blood glucose and maintain a healthy lifestyle.  Working with a diet and nutrition specialist (dietitian) can help you make a meal plan that is best for you.  Keep in mind that carbohydrates (carbs) and alcohol have immediate effects on your blood glucose levels. It is important to count carbs and to use alcohol carefully. This information is not intended to replace advice given to you by your health care provider. Make sure you discuss any questions you have with your health care provider. Document Revised: 08/25/2017 Document Reviewed: 10/17/2016 Elsevier Patient Education  [employer].    If you have lab work done today  you will be contacted with your lab results within the next 2 weeks.  If you have not heard from [place] then please contact us. The fastest way to get your results is to register for My Chart.   IF you received an x-ray today, you will receive an invoice from [facility]. Please contact [facility] at [phone] with questions or concerns regarding your invoice.   IF you received labwork today, you will receive an invoice from [facility]. Please contact [facility] at [phone] with questions or concerns regarding your invoice.   Our billing staff will not be able to assist you with questions regarding bills from these companies.  You will be contacted with the lab results as soon as they are available. The fastest way to get your results is to activate your My Chart account. Instructions are located on the last page of this paperwork. If you have not heard  from [place] regarding the results in 2 weeks, please contact this office.

## 2020-02-03 NOTE — Progress Notes (Signed)
Oscar Fernandez 44 y.o.   Chief Complaint  Patient presents with  . Diabetes    follow up 6 month    HISTORY OF PRESENT ILLNESS: This is a 44 y.o. male with history of diabetes and dyslipidemia here for follow-up. 1.  Diabetes: On Metformin and Trulicity weekly.  Doing well.  No complaints. Lab Results  Component Value Date   HGBA1C 7.3 (A) 07/18/2019   2.  Dyslipidemia: On Crestor 20 mg daily. Has no complaints or medical concerns today. Fully vaccinated against Covid.  HPI   Prior to Admission medications   Medication Sig Start Date End Date Taking? Authorizing Provider  acetaminophen (TYLENOL) 500 MG tablet Take 1,000 mg by mouth every 6 (six) hours as needed for mild pain. Reported on 10/29/2015   Yes [provider]  aspirin EC 81 MG tablet Take 1 tablet (81 mg total) by mouth daily. 04/18/14  Yes Shawnee Knapp, MD  Dulaglutide (TRULICITY) 1.51 VO/1.6WV SOPN Inject 0.75 mg into the skin once a week. 07/18/19  Yes Horald Pollen, MD  metFORMIN (GLUCOPHAGE) 1000 MG tablet Take 1 tablet (1,000 mg total) by mouth 2 (two) times daily with a meal. 07/18/19  Yes Mazi Brailsford, Ines Bloomer, MD  rosuvastatin (CRESTOR) 20 MG tablet Take 1 tablet (20 mg total) by mouth daily. 07/18/19  Yes Raeleigh Guinn, Ines Bloomer, MD  loperamide (IMODIUM) 2 MG capsule Take 1 capsule (2 mg total) by mouth 2 (two) times daily as needed for diarrhea or loose stools. Patient not taking: Reported on 02/03/2020 07/25/16   Larene Pickett, PA-C    No Known Allergies  Patient Active Problem List   Diagnosis Date Noted  . Type 2 diabetes mellitus ([place]) 04/19/2014  . Hyperlipidemia LDL goal <100 04/19/2014    Past Medical History:  Diagnosis Date  . Diabetes mellitus without complication ([place])   . Hyperlipidemia     No past surgical history on file.  Social History   Socioeconomic History  . Marital status: Married    Spouse name: Social research officer, government  . Number of children: 4  . Years of education:  college  . Highest education level: Not on file  Occupational History  . Occupation: industrial  Tobacco Use  . Smoking status: Never Smoker  . Smokeless tobacco: Never Used  Substance and Sexual Activity  . Alcohol use: No  . Drug use: No  . Sexual activity: Yes  Other Topics Concern  . Not on file  Social History Narrative  . Not on file   Social Determinants of Health   Financial Resource Strain:   . Difficulty of Paying Living Expenses:   Food Insecurity:   . Worried About Charity fundraiser in the Last Year:   . Arboriculturist in the Last Year:   Transportation Needs:   . Film/video editor (Medical):   Marland Kitchen Lack of Transportation (Non-Medical):   Physical Activity:   . Days of Exercise per Week:   . Minutes of Exercise per Session:   Stress:   . Feeling of Stress :   Social Connections:   . Frequency of Communication with Friends and Family:   . Frequency of Social Gatherings with Friends and Family:   . Attends Religious Services:   . Active Member of Clubs or Organizations:   . Attends Archivist Meetings:   Marland Kitchen Marital Status:   Intimate Partner Violence:   . Fear of Current or Ex-Partner:   . Emotionally Abused:   .  Physically Abused:   . Sexually Abused:     No family history on file.   Review of Systems  Constitutional: Negative.  Negative for chills and fever.  HENT: Negative.  Negative for congestion and sore throat.   Respiratory: Negative.  Negative for cough and shortness of breath.   Cardiovascular: Negative.  Negative for chest pain and palpitations.  Gastrointestinal: Negative.  Negative for abdominal pain, diarrhea, nausea and vomiting.  Genitourinary: Negative.  Negative for dysuria and hematuria.  Musculoskeletal: Negative.  Negative for back pain, myalgias and neck pain.  Skin: Negative.  Negative for rash.  Neurological: Negative for dizziness and headaches.  All other systems reviewed and are negative.   Today's Vitals     02/03/20 0946  BP: 118/79  Pulse: 74  Resp: 16  Temp: (!) 97.3 F (36.3 C)  TempSrc: Temporal  SpO2: 97%  Weight: 192 lb 9.6 oz (87.4 kg)  Height: 5\' 7"  (1.702 m)   Body mass index is 30.17 kg/m.  Physical Exam Vitals reviewed.  Constitutional:      Appearance: Normal appearance.  HENT:     Head: Normocephalic.  Eyes:     Extraocular Movements: Extraocular movements intact.     Conjunctiva/sclera: Conjunctivae normal.     Pupils: Pupils are equal, round, and reactive to light.  Cardiovascular:     Rate and Rhythm: Normal rate and regular rhythm.     Pulses: Normal pulses.     Heart sounds: Normal heart sounds.  Pulmonary:     Effort: Pulmonary effort is normal.     Breath sounds: Normal breath sounds.  Abdominal:     Palpations: Abdomen is soft.     Tenderness: There is no abdominal tenderness.  Musculoskeletal:        General: Normal range of motion.     Cervical back: Normal range of motion and neck supple.  Skin:    General: Skin is warm and dry.     Capillary Refill: Capillary refill takes less than 2 seconds.  Neurological:     General: No focal deficit present.     Mental Status: He is alert and oriented to person, place, and time.  Psychiatric:        Mood and Affect: Mood normal.        Behavior: Behavior normal.      Results for orders placed or performed in visit on 02/03/20 (from the past 24 hour(s))  POCT glucose (manual entry)     Status: Abnormal   Collection Time: 02/03/20 10:27 AM  Result Value Ref Range   POC Glucose 258 (A) 70 - 99 mg/dl  POCT glycosylated hemoglobin (Hb A1C)     Status: Abnormal   Collection Time: 02/03/20 10:33 AM  Result Value Ref Range   Hemoglobin A1C 8.5 (A) 4.0 - 5.6 %   HbA1c POC (<> result, manual entry)     HbA1c, POC (prediabetic range)     HbA1c, POC (controlled diabetic range)       ASSESSMENT & PLAN: Dyslipidemia associated with type 2 diabetes mellitus (HCC) Uncontrolled diabetes with hemoglobin A1c  at 8.6, higher than before.  On compliant with diet and nutrition.  Continue Metformin 1000 mg twice a day and increase Trulicity to 1.5 mg weekly.  Diet and nutrition as well as physical exercise discussed with patient. Follow-up in 3 months. Continue statin therapy with Crestor 20 mg daily.  Oscar Fernandez was seen today for diabetes.  Diagnoses and all orders for this visit:  Dyslipidemia associated with type 2 diabetes mellitus (HCC) -     CBC with Differential/Platelet -     Comprehensive metabolic panel -     Lipid panel -     POCT glucose (manual entry) -     POCT glycosylated hemoglobin (Hb A1C) -     HM Diabetes Foot Exam -     Ambulatory referral to Ophthalmology -     Microalbumin, urine -     metFORMIN (GLUCOPHAGE) 1000 MG tablet; Take 1 tablet (1,000 mg total) by mouth 2 (two) times daily with a meal. -     Dulaglutide (TRULICITY) 1.5 MG/0.5ML SOPN; Inject 1.5 mg into the skin once a week.  Uncontrolled type 2 diabetes mellitus with hyperglycemia (HCC)    Patient Instructions    Increase Trulicity dose to 1.5 mg weekly.  May use 2 doses of 0.75 mg pens. Continue Metformin 1000 mg twice a day. Follow-up in 3 months.   Diabetes Mellitus and Nutrition, Adult When you have diabetes (diabetes mellitus), it is very important to have healthy eating habits because your blood sugar (glucose) levels are greatly affected by what you eat and drink. Eating healthy foods in the appropriate amounts, at about the same times every day, can help you:  Control your blood glucose.  Lower your risk of heart disease.  Improve your blood pressure.  Reach or maintain a healthy weight. Every person with diabetes is different, and each person has different needs for a meal plan. Your health care provider may recommend that you work with a diet and nutrition specialist (dietitian) to make a meal plan that is best for you. Your meal plan may vary depending on factors such as:  The calories you  need.  The medicines you take.  Your weight.  Your blood glucose, blood pressure, and cholesterol levels.  Your activity level.  Other health conditions you have, such as heart or kidney disease. How do carbohydrates affect me? Carbohydrates, also called carbs, affect your blood glucose level more than any other type of food. Eating carbs naturally raises the amount of glucose in your blood. Carb counting is a method for keeping track of how many carbs you eat. Counting carbs is important to keep your blood glucose at a healthy level, especially if you use insulin or take certain oral diabetes medicines. It is important to know how many carbs you can safely have in each meal. This is different for every person. Your dietitian can help you calculate how many carbs you should have at each meal and for each snack. Foods that contain carbs include:  Bread, cereal, rice, pasta, and crackers.  Potatoes and corn.  Peas, beans, and lentils.  Milk and yogurt.  Fruit and juice.  Desserts, such as cakes, cookies, ice cream, and candy. How does alcohol affect me? Alcohol can cause a sudden decrease in blood glucose (hypoglycemia), especially if you use insulin or take certain oral diabetes medicines. Hypoglycemia can be a life-threatening condition. Symptoms of hypoglycemia (sleepiness, dizziness, and confusion) are similar to symptoms of having too much alcohol. If your health care provider says that alcohol is safe for you, follow these guidelines:  Limit alcohol intake to no more than 1 drink per day for nonpregnant women and 2 drinks per day for men. One drink equals 12 oz of beer, 5 oz of wine, or 1 oz of hard liquor.  Do not drink on an empty stomach.  Keep yourself hydrated with water, diet soda, or  unsweetened iced tea.  Keep in mind that regular soda, juice, and other mixers may contain a lot of sugar and must be counted as carbs. What are tips for following this plan?  Reading  food labels  Start by checking the serving size on the "Nutrition Facts" label of packaged foods and drinks. The amount of calories, carbs, fats, and other nutrients listed on the label is based on one serving of the item. Many items contain more than one serving per package.  Check the total grams (g) of carbs in one serving. You can calculate the number of servings of carbs in one serving by dividing the total carbs by 15. For example, if a food has 30 g of total carbs, it would be equal to 2 servings of carbs.  Check the number of grams (g) of saturated and trans fats in one serving. Choose foods that have low or no amount of these fats.  Check the number of milligrams (mg) of salt (sodium) in one serving. Most people should limit total sodium intake to less than 2,300 mg per day.  Always check the nutrition information of foods labeled as "low-fat" or "nonfat". These foods may be higher in added sugar or refined carbs and should be avoided.  Talk to your dietitian to identify your daily goals for nutrients listed on the label. Shopping  Avoid buying canned, premade, or processed foods. These foods tend to be high in fat, sodium, and added sugar.  Shop around the outside edge of the grocery store. This includes fresh fruits and vegetables, bulk grains, fresh meats, and fresh dairy. Cooking  Use low-heat cooking methods, such as baking, instead of high-heat cooking methods like deep frying.  Cook using healthy oils, such as olive, canola, or sunflower oil.  Avoid cooking with butter, cream, or high-fat meats. Meal planning  Eat meals and snacks regularly, preferably at the same times every day. Avoid going long periods of time without eating.  Eat foods high in fiber, such as fresh fruits, vegetables, beans, and whole grains. Talk to your dietitian about how many servings of carbs you can eat at each meal.  Eat 4-6 ounces (oz) of lean protein each day, such as lean meat, chicken,  fish, eggs, or tofu. One oz of lean protein is equal to: ? 1 oz of meat, chicken, or fish. ? 1 egg. ?  cup of tofu.  Eat some foods each day that contain healthy fats, such as avocado, nuts, seeds, and fish. Lifestyle  Check your blood glucose regularly.  Exercise regularly as told by your health care provider. This may include: ? 150 minutes of moderate-intensity or vigorous-intensity exercise each week. This could be brisk walking, biking, or water aerobics. ? Stretching and doing strength exercises, such as yoga or weightlifting, at least 2 times a week.  Take medicines as told by your health care provider.  Do not use any products that contain nicotine or tobacco, such as cigarettes and e-cigarettes. If you need help quitting, ask your health care provider.  Work with a Veterinary surgeon or diabetes educator to identify strategies to manage stress and any emotional and social challenges. Questions to ask a health care provider  Do I need to meet with a diabetes educator?  Do I need to meet with a dietitian?  What number can I call if I have questions?  When are the best times to check my blood glucose? Where to find more information:  American Diabetes Association: diabetes.org  Academy  of Nutrition and Dietetics: www.eatright.[employer] of Diabetes and Digestive and Kidney Diseases (NIH): CarFlippers.tn Summary  A healthy meal plan will help you control your blood glucose and maintain a healthy lifestyle.  Working with a diet and nutrition specialist (dietitian) can help you make a meal plan that is best for you.  Keep in mind that carbohydrates (carbs) and alcohol have immediate effects on your blood glucose levels. It is important to count carbs and to use alcohol carefully. This information is not intended to replace advice given to you by your health care provider. Make sure you discuss any questions you have with your health care provider. Document  Revised: 08/25/2017 Document Reviewed: 10/17/2016 Elsevier Patient Education  [employer].    If you have lab work done today you will be contacted with your lab results within the next 2 weeks.  If you have not heard from [place] then please contact us. The fastest way to get your results is to register for My Chart.   IF you received an x-ray today, you will receive an invoice from [facility]. Please contact [facility] at [phone] with questions or concerns regarding your invoice.   IF you received labwork today, you will receive an invoice from [facility]. Please contact [facility] at [phone] with questions or concerns regarding your invoice.   Our billing staff will not be able to assist you with questions regarding bills from these companies.  You will be contacted with the lab results as soon as they are available. The fastest way to get your results is to activate your My Chart account. Instructions are located on the last page of this paperwork. If you have not heard from [place] regarding the results in 2 weeks, please contact this office.         Edwina Barth, MD [facility]

## 2020-02-03 NOTE — Assessment & Plan Note (Signed)
Uncontrolled diabetes with hemoglobin A1c at 8.6, higher than before.  On compliant with diet and nutrition.  Continue Metformin 1000 mg twice a day and increase Trulicity to 1.5 mg weekly.  Diet and nutrition as well as physical exercise discussed with patient. Follow-up in 3 months. Continue statin therapy with Crestor 20 mg daily.

## 2020-02-04 LAB — CBC WITH DIFFERENTIAL/PLATELET
Basophils Absolute: 0 10*3/uL (ref 0.0–0.2)
Basos: 0 %
EOS (ABSOLUTE): 0.2 10*3/uL (ref 0.0–0.4)
Eos: 4 %
Hematocrit: 46.7 % (ref 37.5–51.0)
Hemoglobin: 15.4 g/dL (ref 13.0–17.7)
Immature Grans (Abs): 0 10*3/uL (ref 0.0–0.1)
Immature Granulocytes: 0 %
Lymphocytes Absolute: 1.4 10*3/uL (ref 0.7–3.1)
Lymphs: 30 %
MCH: 28.9 pg (ref 26.6–33.0)
MCHC: 33 g/dL (ref 31.5–35.7)
MCV: 88 fL (ref 79–97)
Monocytes Absolute: 0.4 10*3/uL (ref 0.1–0.9)
Monocytes: 10 %
Neutrophils Absolute: 2.5 10*3/uL (ref 1.4–7.0)
Neutrophils: 56 %
Platelets: 237 10*3/uL (ref 150–450)
RBC: 5.32 x10E6/uL (ref 4.14–5.80)
RDW: 12.8 % (ref 11.6–15.4)
WBC: 4.5 10*3/uL (ref 3.4–10.8)

## 2020-02-04 LAB — COMPREHENSIVE METABOLIC PANEL
ALT: 38 IU/L (ref 0–44)
AST: 25 IU/L (ref 0–40)
Albumin/Globulin Ratio: 2.2 (ref 1.2–2.2)
Albumin: 4.4 g/dL (ref 4.0–5.0)
Alkaline Phosphatase: 71 IU/L (ref 39–117)
BUN/Creatinine Ratio: 20 (ref 9–20)
BUN: 17 mg/dL (ref 6–24)
Bilirubin Total: 0.5 mg/dL (ref 0.0–1.2)
CO2: 22 mmol/L (ref 20–29)
Calcium: 9.7 mg/dL (ref 8.7–10.2)
Chloride: 104 mmol/L (ref 96–106)
Creatinine, Ser: 0.87 mg/dL (ref 0.76–1.27)
GFR calc Af Amer: 121 mL/min/{1.73_m2} (ref >59)
GFR calc non Af Amer: 105 mL/min/{1.73_m2} (ref >59)
Globulin, Total: 2 g/dL (ref 1.5–4.5)
Glucose: 246 mg/dL — ABNORMAL HIGH (ref 65–99)
Potassium: 4.7 mmol/L (ref 3.5–5.2)
Sodium: 140 mmol/L (ref 134–144)
Total Protein: 6.4 g/dL (ref 6.0–8.5)

## 2020-02-04 LAB — LIPID PANEL
Chol/HDL Ratio: 3.8 ratio (ref 0.0–5.0)
Cholesterol, Total: 118 mg/dL (ref 100–199)
HDL: 31 mg/dL — ABNORMAL LOW (ref >39)
LDL Chol Calc (NIH): 69 mg/dL (ref 0–99)
Triglycerides: 90 mg/dL (ref 0–149)
VLDL Cholesterol Cal: 18 mg/dL (ref 5–40)

## 2020-02-04 LAB — MICROALBUMIN, URINE: Microalbumin, Urine: 3.5 ug/mL

## 2020-02-18 LAB — HM DIABETES EYE EXAM

## 2020-02-19 ENCOUNTER — Encounter: Payer: Self-pay | Admitting: *Deleted

## 2020-05-05 ENCOUNTER — Telehealth: Payer: Self-pay | Admitting: Emergency Medicine

## 2020-05-05 NOTE — Telephone Encounter (Signed)
Referral Followup °

## 2020-05-06 ENCOUNTER — Other Ambulatory Visit: Payer: Self-pay | Admitting: Emergency Medicine

## 2020-05-06 DIAGNOSIS — E1165 Type 2 diabetes mellitus with hyperglycemia: Secondary | ICD-10-CM

## 2020-05-06 DIAGNOSIS — E1169 Type 2 diabetes mellitus with other specified complication: Secondary | ICD-10-CM

## 2020-05-06 DIAGNOSIS — E785 Hyperlipidemia, unspecified: Secondary | ICD-10-CM

## 2020-05-06 MED ORDER — TRULICITY 1.5 MG/0.5ML ~~LOC~~ SOAJ: 1.5000 mg | SUBCUTANEOUS | 3 refills | Status: DC

## 2020-05-06 NOTE — Telephone Encounter (Signed)
Taken care of. Thanks. The new dose is 1.5 mg weekly. I sent a new prescription.

## 2020-05-06 NOTE — Telephone Encounter (Signed)
Taken care of. Thanks!

## 2020-05-06 NOTE — Telephone Encounter (Signed)
I was going to refill this medication but in the note to the pharmacy is states  "Please notice dose change.  Hold this prescription for future requests.  Thanks"     I do not see anything in your office notes to as why this message was place on the rx . Is this ok to refill? If so then can I also remove msg to the pharamcay?

## 2020-11-23 ENCOUNTER — Other Ambulatory Visit: Payer: Self-pay | Admitting: Emergency Medicine

## 2020-11-23 DIAGNOSIS — E1169 Type 2 diabetes mellitus with other specified complication: Secondary | ICD-10-CM

## 2020-11-23 DIAGNOSIS — E785 Hyperlipidemia, unspecified: Secondary | ICD-10-CM

## 2020-11-23 NOTE — Telephone Encounter (Signed)
Requested medication (s) are due for refill today:  Yes  Requested medication (s) are on the active medication list:   Yes  Future visit scheduled:   No   Last ordered: 05/06/2020 6 ml,  3 refills  Clinic note:  Returned because it failed protocol due to an invalid encounter within the past 6 mo.  [facility] does not schedule for [place].   Requested Prescriptions  Pending Prescriptions Disp Refills   TRULICITY 1.5 MG/0.5ML SOPN [Pharmacy Med Name: Trulicity 1.5 MG/0.5ML Subcutaneous Solution Pen-injector] 4 mL 0    Sig: INJECT 0.5 MLS ( 1.5 MG TOTAL ) INTO THE SKIN ONCE A WEEK      Endocrinology:  Diabetes - GLP-1 Receptor Agonists Failed - 11/23/2020 11:20 AM      Failed - HBA1C is between 0 and 7.9 and within 180 days    Hemoglobin A1C  Date Value Ref Range Status  02/03/2020 8.5 (A) 4.0 - 5.6 % Final   Hgb A1c MFr Bld  Date Value Ref Range Status  04/11/2019 8.5 (H) 4.8 - 5.6 % Final    Comment:             Prediabetes: 5.7 - 6.4          Diabetes: >6.4          Glycemic control for adults with diabetes: <7.0           Failed - Valid encounter within last 6 months    Recent Outpatient Visits           9 months ago Dyslipidemia associated with type 2 diabetes mellitus [facility])   Primary Care at [facility], Eilleen Kempf, MD   1 year ago Dyslipidemia associated with type 2 diabetes mellitus [facility])   Primary Care at [place], Eilleen Kempf, MD   1 year ago Dyslipidemia associated with type 2 diabetes mellitus [facility])   Primary Care at [place], [place], MD   2 years ago Type 2 diabetes mellitus with hyperglycemia, without long-term current use of insulin [facility])   Primary Care at [place], [place], MD   2 years ago Type 2 diabetes mellitus with hyperglycemia, without long-term current use of insulin [facility])   Primary Care at [facility], [place], MD

## 2021-02-26 ENCOUNTER — Other Ambulatory Visit: Payer: Self-pay | Admitting: Emergency Medicine

## 2021-02-26 ENCOUNTER — Other Ambulatory Visit: Payer: Self-pay | Admitting: *Deleted

## 2021-02-26 DIAGNOSIS — E785 Hyperlipidemia, unspecified: Secondary | ICD-10-CM

## 2021-02-26 DIAGNOSIS — E1169 Type 2 diabetes mellitus with other specified complication: Secondary | ICD-10-CM

## 2021-02-26 MED ORDER — METFORMIN HCL 1000 MG PO TABS: 1000.0000 mg | ORAL_TABLET | Freq: Two times a day (BID) | ORAL | 0 refills | Status: DC

## 2021-02-27 ENCOUNTER — Telehealth: Payer: Self-pay | Admitting: Emergency Medicine

## 2021-02-27 DIAGNOSIS — E1169 Type 2 diabetes mellitus with other specified complication: Secondary | ICD-10-CM

## 2021-02-27 MED ORDER — TRULICITY 1.5 MG/0.5ML ~~LOC~~ SOAJ: 1.5000 mg | SUBCUTANEOUS | 0 refills | Status: DC

## 2021-03-02 NOTE — Telephone Encounter (Signed)
Called patient left mobile voice mail to schedule appointment for additional refills. Courtesy refill approved 02/25/2021.

## 2021-03-02 NOTE — Telephone Encounter (Signed)
Called mobile number left message to schedule appointment for additional Metformin refill. Courtesy refill on 02/25/2021.

## 2021-03-03 NOTE — Telephone Encounter (Signed)
Patient is scheduled to see PCP on 6/27.

## 2021-03-22 ENCOUNTER — Ambulatory Visit (INDEPENDENT_AMBULATORY_CARE_PROVIDER_SITE_OTHER): Payer: BC Managed Care – PPO | Admitting: Emergency Medicine

## 2021-03-22 ENCOUNTER — Encounter: Payer: Self-pay | Admitting: Emergency Medicine

## 2021-03-22 ENCOUNTER — Other Ambulatory Visit: Payer: Self-pay

## 2021-03-22 VITALS — BP 130/80 | HR 84 | Temp 98.4°F | Ht 67.0 in | Wt 190.4 lb

## 2021-03-22 DIAGNOSIS — E785 Hyperlipidemia, unspecified: Secondary | ICD-10-CM

## 2021-03-22 DIAGNOSIS — E1169 Type 2 diabetes mellitus with other specified complication: Secondary | ICD-10-CM | POA: Diagnosis not present

## 2021-03-22 LAB — POCT GLYCOSYLATED HEMOGLOBIN (HGB A1C): Hemoglobin A1C: 8.7 % — AB (ref 4.0–5.6)

## 2021-03-22 MED ORDER — ROSUVASTATIN CALCIUM 20 MG PO TABS: 20.0000 mg | ORAL_TABLET | Freq: Every day | ORAL | 3 refills | Status: DC

## 2021-03-22 MED ORDER — METFORMIN HCL 1000 MG PO TABS: 1000.0000 mg | ORAL_TABLET | Freq: Two times a day (BID) | ORAL | 3 refills | Status: DC

## 2021-03-22 MED ORDER — TRULICITY 3 MG/0.5ML ~~LOC~~ SOAJ: 3.0000 mg | SUBCUTANEOUS | 3 refills | Status: DC

## 2021-03-22 NOTE — Assessment & Plan Note (Signed)
Uncontrolled diabetes with hemoglobin A1c at 8.7. Will increase Trulicity to 3 mg weekly Continue metformin 1000 mg twice a day. Diet and nutrition discussed advised to decrease amount of daily carbohydrate. Continue rosuvastatin 20 mg daily. Follow-up in 3 months.

## 2021-03-22 NOTE — Patient Instructions (Signed)
Diabetes Mellitus and Nutrition, Adult When you have diabetes, or diabetes mellitus, it is very important to have healthy eating habits because your blood sugar (glucose) levels are greatly affected by what you eat and drink. Eating healthy foods in the right amounts, at about the same times every day, can help you:  Control your blood glucose.  Lower your risk of heart disease.  Improve your blood pressure.  Reach or maintain a healthy weight. What can affect my meal plan? Every person with diabetes is different, and each person has different needs for a meal plan. Your health care provider may recommend that you work with a dietitian to make a meal plan that is best for you. Your meal plan may vary depending on factors such as:  The calories you need.  The medicines you take.  Your weight.  Your blood glucose, blood pressure, and cholesterol levels.  Your activity level.  Other health conditions you have, such as heart or kidney disease. How do carbohydrates affect me? Carbohydrates, also called carbs, affect your blood glucose level more than any other type of food. Eating carbs naturally raises the amount of glucose in your blood. Carb counting is a method for keeping track of how many carbs you eat. Counting carbs is important to keep your blood glucose at a healthy level, especially if you use insulin or take certain oral diabetes medicines. It is important to know how many carbs you can safely have in each meal. This is different for every person. Your dietitian can help you calculate how many carbs you should have at each meal and for each snack. How does alcohol affect me? Alcohol can cause a sudden decrease in blood glucose (hypoglycemia), especially if you use insulin or take certain oral diabetes medicines. Hypoglycemia can be a life-threatening condition. Symptoms of hypoglycemia, such as sleepiness, dizziness, and confusion, are similar to symptoms of having too much  alcohol.  Do not drink alcohol if: ? Your health care provider tells you not to drink. ? You are pregnant, may be pregnant, or are planning to become pregnant.  If you drink alcohol: ? Do not drink on an empty stomach. ? Limit how much you use to:  0-1 drink a day for women.  0-2 drinks a day for men. ? Be aware of how much alcohol is in your drink. In the [place] one drink equals one 12 oz bottle of beer (355 mL), one 5 oz glass of wine (148 mL), or one 1 oz glass of hard liquor (44 mL). ? Keep yourself hydrated with water, diet soda, or unsweetened iced tea.  Keep in mind that regular soda, juice, and other mixers may contain a lot of sugar and must be counted as carbs. What are tips for following this plan? Reading food labels  Start by checking the serving size on the "Nutrition Facts" label of packaged foods and drinks. The amount of calories, carbs, fats, and other nutrients listed on the label is based on one serving of the item. Many items contain more than one serving per package.  Check the total grams (g) of carbs in one serving. You can calculate the number of servings of carbs in one serving by dividing the total carbs by 15. For example, if a food has 30 g of total carbs per serving, it would be equal to 2 servings of carbs.  Check the number of grams (g) of saturated fats and trans fats in one serving. Choose foods that have   a low amount or none of these fats.  Check the number of milligrams (mg) of salt (sodium) in one serving. Most people should limit total sodium intake to less than 2,300 mg per day.  Always check the nutrition information of foods labeled as "low-fat" or "nonfat." These foods may be higher in added sugar or refined carbs and should be avoided.  Talk to your dietitian to identify your daily goals for nutrients listed on the label. Shopping  Avoid buying canned, pre-made, or processed foods. These foods tend to be high in fat, sodium, and added  sugar.  Shop around the outside edge of the grocery store. This is where you will most often find fresh fruits and vegetables, bulk grains, fresh meats, and fresh dairy. Cooking  Use low-heat cooking methods, such as baking, instead of high-heat cooking methods like deep frying.  Cook using healthy oils, such as olive, canola, or sunflower oil.  Avoid cooking with butter, cream, or high-fat meats. Meal planning  Eat meals and snacks regularly, preferably at the same times every day. Avoid going long periods of time without eating.  Eat foods that are high in fiber, such as fresh fruits, vegetables, beans, and whole grains. Talk with your dietitian about how many servings of carbs you can eat at each meal.  Eat 4-6 oz (112-168 g) of lean protein each day, such as lean meat, chicken, fish, eggs, or tofu. One ounce (oz) of lean protein is equal to: ? 1 oz (28 g) of meat, chicken, or fish. ? 1 egg. ?  cup (62 g) of tofu.  Eat some foods each day that contain healthy fats, such as avocado, nuts, seeds, and fish.   What foods should I eat? Fruits Berries. Apples. Oranges. Peaches. Apricots. Plums. Grapes. Mango. Papaya. Pomegranate. Kiwi. Cherries. Vegetables Lettuce. Spinach. Leafy greens, including kale, chard, collard greens, and mustard greens. Beets. Cauliflower. Cabbage. Broccoli. Carrots. Green beans. Tomatoes. Peppers. Onions. Cucumbers. Brussels sprouts. Grains Whole grains, such as whole-wheat or whole-grain bread, crackers, tortillas, cereal, and pasta. Unsweetened oatmeal. Quinoa. Brown or wild rice. Meats and other proteins Seafood. Poultry without skin. Lean cuts of poultry and beef. Tofu. Nuts. Seeds. Dairy Low-fat or fat-free dairy products such as milk, yogurt, and cheese. The items listed above may not be a complete list of foods and beverages you can eat. Contact a dietitian for more information. What foods should I avoid? Fruits Fruits canned with  syrup. Vegetables Canned vegetables. Frozen vegetables with butter or cream sauce. Grains Refined white flour and flour products such as bread, pasta, snack foods, and cereals. Avoid all processed foods. Meats and other proteins Fatty cuts of meat. Poultry with skin. Breaded or fried meats. Processed meat. Avoid saturated fats. Dairy Full-fat yogurt, cheese, or milk. Beverages Sweetened drinks, such as soda or iced tea. The items listed above may not be a complete list of foods and beverages you should avoid. Contact a dietitian for more information. Questions to ask a health care provider  Do I need to meet with a diabetes educator?  Do I need to meet with a dietitian?  What number can I call if I have questions?  When are the best times to check my blood glucose? Where to find more information:  American Diabetes Association: diabetes.org  Academy of Nutrition and Dietetics: www.eatright.org  National Institute of Diabetes and Digestive and Kidney Diseases: www.niddk.nih.gov  Association of Diabetes Care and Education Specialists: www.diabeteseducator.org Summary  It is important to have healthy eating   habits because your blood sugar (glucose) levels are greatly affected by what you eat and drink.  A healthy meal plan will help you control your blood glucose and maintain a healthy lifestyle.  Your health care provider may recommend that you work with a dietitian to make a meal plan that is best for you.  Keep in mind that carbohydrates (carbs) and alcohol have immediate effects on your blood glucose levels. It is important to count carbs and to use alcohol carefully. This information is not intended to replace advice given to you by your health care provider. Make sure you discuss any questions you have with your health care provider. Document Revised: 08/20/2019 Document Reviewed: 08/20/2019 Elsevier Patient Education  2021 Elsevier Inc.  

## 2021-03-22 NOTE — Progress Notes (Signed)
Lab Results  Component Value Date   HGBA1C 8.5 (A) 02/03/2020   BP Readings from Last 3 Encounters:  03/22/21 130/80  02/03/20 118/79  07/18/19 127/84   Wt Readings from Last 3 Encounters:  03/22/21 190 lb 6.4 oz (86.4 kg)  02/03/20 192 lb 9.6 oz (87.4 kg)  07/18/19 194 lb 6.4 oz (88.2 kg)   Lab Results  Component Value Date   CREATININE 0.87 02/03/2020   BUN 17 02/03/2020   NA 140 02/03/2020   K 4.7 02/03/2020   CL 104 02/03/2020   CO2 22 02/03/2020   Oscar Fernandez 44 y.o.   Chief Complaint  Patient presents with   Diabetes    Follow up     HISTORY OF PRESENT ILLNESS: This is a 45 y.o. male with history of dyslipidemia and diabetes here for follow-up. Last office visit with me over a year ago.  States he is compliant with medications but not so much with his diet. Presently on Trulicity 1.5 mg weekly, metformin 1000 mg twice a day, and rosuvastatin 20 mg daily. No other complaints or medical concerns today.  Diabetes Pertinent negatives for hypoglycemia include no dizziness or headaches. Pertinent negatives for diabetes include no chest pain.    Prior to Admission medications   Medication Sig Start Date End Date Taking? Authorizing Provider  aspirin EC 81 MG tablet Take 1 tablet (81 mg total) by mouth daily. 04/18/14  Yes Sherren MochaShaw, Eva N, MD  Dulaglutide (TRULICITY) 1.5 MG/0.5ML SOPN Inject 1.5 mg into the skin once a week. 02/27/21  Yes Georgina QuintSagardia, Aairah Negrette Jose, MD  metFORMIN (GLUCOPHAGE) 1000 MG tablet Take 1 tablet (1,000 mg total) by mouth 2 (two) times daily with a meal. 02/26/21  Yes Maron Stanzione, Eilleen KempfMiguel Jose, MD  rosuvastatin (CRESTOR) 20 MG tablet Take 1 tablet (20 mg total) by mouth daily. 07/18/19  Yes Evanthia Maund, Eilleen KempfMiguel Jose, MD  acetaminophen (TYLENOL) 500 MG tablet Take 1,000 mg by mouth every 6 (six) hours as needed for mild pain. Reported on 10/29/2015 Patient not taking: Reported on 03/22/2021    [provider]  loperamide (IMODIUM) 2 MG capsule Take 1 capsule  (2 mg total) by mouth 2 (two) times daily as needed for diarrhea or loose stools. Patient not taking: Reported on 03/22/2021 07/25/16   Garlon HatchetSanders, Lisa M, PA-C    Not on File  Patient Active Problem List   Diagnosis Date Noted   Dyslipidemia associated with type 2 diabetes mellitus (HCC) 04/19/2014   Hyperlipidemia LDL goal <100 04/19/2014    Past Medical History:  Diagnosis Date   Diabetes mellitus without complication (HCC)    Hyperlipidemia     No past surgical history on file.  Social History   Socioeconomic History   Marital status: Married    Spouse name: [place]   Number of children: 4   Years of education: college   Highest education level: Not on file  Occupational History   Occupation: industrial  Tobacco Use   Smoking status: Never   Smokeless tobacco: Never  Vaping Use   Vaping Use: Never used  Substance and Sexual Activity   Alcohol use: No   Drug use: No   Sexual activity: Yes  Other Topics Concern   Not on file  Social History Narrative   Not on file   Social Determinants of Health   Financial Resource Strain: Not on file  Food Insecurity: Not on file  Transportation Needs: Not on file  Physical Activity: Not on file  Stress: Not  on file  Social Connections: Not on file  Intimate Partner Violence: Not on file    No family history on file.   Review of Systems  Constitutional: Negative.  Negative for chills and fever.  HENT: Negative.  Negative for congestion and sore throat.   Respiratory: Negative.  Negative for cough and shortness of breath.   Cardiovascular: Negative.  Negative for chest pain and palpitations.  Gastrointestinal: Negative.  Negative for abdominal pain, diarrhea, nausea and vomiting.  Genitourinary: Negative.  Negative for dysuria and hematuria.  Musculoskeletal: Negative.  Negative for back pain, myalgias and neck pain.  Skin: Negative.  Negative for rash.  Neurological: Negative.  Negative for dizziness and  headaches.  All other systems reviewed and are negative.  Vitals:   03/22/21 1546  BP: 130/80  Pulse: 84  Temp: 98.4 F (36.9 C)  SpO2: 97%   Wt Readings from Last 3 Encounters:  03/22/21 190 lb 6.4 oz (86.4 kg)  02/03/20 192 lb 9.6 oz (87.4 kg)  07/18/19 194 lb 6.4 oz (88.2 kg)    Physical Exam Vitals reviewed.  Constitutional:      Appearance: Normal appearance.  HENT:     Head: Normocephalic.  Eyes:     Extraocular Movements: Extraocular movements intact.     Conjunctiva/sclera: Conjunctivae normal.     Pupils: Pupils are equal, round, and reactive to light.  Cardiovascular:     Rate and Rhythm: Normal rate and regular rhythm.     Pulses: Normal pulses.     Heart sounds: Normal heart sounds.  Pulmonary:     Effort: Pulmonary effort is normal.     Breath sounds: Normal breath sounds.  Abdominal:     General: Bowel sounds are normal. There is no distension.     Palpations: Abdomen is soft.     Tenderness: There is no abdominal tenderness.  Musculoskeletal:        General: Normal range of motion.     Cervical back: Normal range of motion and neck supple.  Skin:    General: Skin is warm and dry.     Capillary Refill: Capillary refill takes less than 2 seconds.  Neurological:     General: No focal deficit present.     Mental Status: He is alert and oriented to person, place, and time.  Psychiatric:        Mood and Affect: Mood normal.        Behavior: Behavior normal.   Results for orders placed or performed in visit on 03/22/21 (from the past 24 hour(s))  POCT glycosylated hemoglobin (Hb A1C)     Status: Abnormal   Collection Time: 03/22/21  4:10 PM  Result Value Ref Range   Hemoglobin A1C 8.7 (A) 4.0 - 5.6 %   HbA1c POC (<> result, manual entry)     HbA1c, POC (prediabetic range)     HbA1c, POC (controlled diabetic range)       ASSESSMENT & PLAN: A total of 30 minutes was spent with the patient and counseling/coordination of care regarding diabetes and  dyslipidemia and cardiovascular risks associated with these conditions, review of most recent blood work results including today's hemoglobin A1c, review of all medications and changes made in particular increasing Trulicity to 3 mg weekly, education on nutrition and need to decrease amount of daily carbohydrate intake, need to increase daily physical exercise, review of most recent office visit notes,.  Dyslipidemia associated with type 2 diabetes mellitus (HCC) Uncontrolled diabetes with hemoglobin A1c at  8.7. Will increase Trulicity to 3 mg weekly Continue metformin 1000 mg twice a day. Diet and nutrition discussed advised to decrease amount of daily carbohydrate. Continue rosuvastatin 20 mg daily. Follow-up in 3 months. Gurtaj was seen today for diabetes.  Diagnoses and all orders for this visit:  Dyslipidemia associated with type 2 diabetes mellitus (HCC) -     POCT glycosylated hemoglobin (Hb A1C) -     Dulaglutide (TRULICITY) 3 MG/0.5ML SOPN; Inject 3 mg as directed once a week. -     metFORMIN (GLUCOPHAGE) 1000 MG tablet; Take 1 tablet (1,000 mg total) by mouth 2 (two) times daily with a meal. -     rosuvastatin (CRESTOR) 20 MG tablet; Take 1 tablet (20 mg total) by mouth daily.  Patient Instructions  Diabetes Mellitus and Nutrition, Adult When you have diabetes, or diabetes mellitus, it is very important to have healthy eating habits because your blood sugar (glucose) levels are greatly affected by what you eat and drink. Eating healthy foods in the right amounts, at about the same times every day, can help you: Control your blood glucose. Lower your risk of heart disease. Improve your blood pressure. Reach or maintain a healthy weight. What can affect my meal plan? Every person with diabetes is different, and each person has different needs for a meal plan. Your health care provider may recommend that you work with a dietitian to make a meal plan that is best for you. Your meal  plan may vary depending on factors such as: The calories you need. The medicines you take. Your weight. Your blood glucose, blood pressure, and cholesterol levels. Your activity level. Other health conditions you have, such as heart or kidney disease. How do carbohydrates affect me? Carbohydrates, also called carbs, affect your blood glucose level more than any other type of food. Eating carbs naturally raises the amount of glucose in your blood. Carb counting is a method for keeping track of how many carbs you eat. Counting carbs is important to keep your blood glucose at a healthy level,especially if you use insulin or take certain oral diabetes medicines. It is important to know how many carbs you can safely have in each meal. This is different for every person. Your dietitian can help you calculate how manycarbs you should have at each meal and for each snack. How does alcohol affect me? Alcohol can cause a sudden decrease in blood glucose (hypoglycemia), especially if you use insulin or take certain oral diabetes medicines. Hypoglycemia can be a life-threatening condition. Symptoms of hypoglycemia, such as sleepiness, dizziness, and confusion, are similar to symptoms of having too much alcohol. Do not drink alcohol if: Your health care provider tells you not to drink. You are pregnant, may be pregnant, or are planning to become pregnant. If you drink alcohol: Do not drink on an empty stomach. Limit how much you use to: 0-1 drink a day for women. 0-2 drinks a day for men. Be aware of how much alcohol is in your drink. In the [place] one drink equals one 12 oz bottle of beer (355 mL), one 5 oz glass of wine (148 mL), or one 1 oz glass of hard liquor (44 mL). Keep yourself hydrated with water, diet soda, or unsweetened iced tea. Keep in mind that regular soda, juice, and other mixers may contain a lot of sugar and must be counted as carbs. What are tips for following this plan?  Reading  food labels Start by checking the serving size  on the "Nutrition Facts" label of packaged foods and drinks. The amount of calories, carbs, fats, and other nutrients listed on the label is based on one serving of the item. Many items contain more than one serving per package. Check the total grams (g) of carbs in one serving. You can calculate the number of servings of carbs in one serving by dividing the total carbs by 15. For example, if a food has 30 g of total carbs per serving, it would be equal to 2 servings of carbs. Check the number of grams (g) of saturated fats and trans fats in one serving. Choose foods that have a low amount or none of these fats. Check the number of milligrams (mg) of salt (sodium) in one serving. Most people should limit total sodium intake to less than 2,300 mg per day. Always check the nutrition information of foods labeled as "low-fat" or "nonfat." These foods may be higher in added sugar or refined carbs and should be avoided. Talk to your dietitian to identify your daily goals for nutrients listed on the label. Shopping Avoid buying canned, pre-made, or processed foods. These foods tend to be high in fat, sodium, and added sugar. Shop around the outside edge of the grocery store. This is where you will most often find fresh fruits and vegetables, bulk grains, fresh meats, and fresh dairy. Cooking Use low-heat cooking methods, such as baking, instead of high-heat cooking methods like deep frying. Cook using healthy oils, such as olive, canola, or sunflower oil. Avoid cooking with butter, cream, or high-fat meats. Meal planning Eat meals and snacks regularly, preferably at the same times every day. Avoid going long periods of time without eating. Eat foods that are high in fiber, such as fresh fruits, vegetables, beans, and whole grains. Talk with your dietitian about how many servings of carbs you can eat at each meal. Eat 4-6 oz (112-168 g) of lean protein each  day, such as lean meat, chicken, fish, eggs, or tofu. One ounce (oz) of lean protein is equal to: 1 oz (28 g) of meat, chicken, or fish. 1 egg.  cup (62 g) of tofu. Eat some foods each day that contain healthy fats, such as avocado, nuts, seeds, and fish. What foods should I eat? Fruits Berries. Apples. Oranges. Peaches. Apricots. Plums. Grapes. Mango. Papaya.Pomegranate. Kiwi. Cherries. Vegetables Lettuce. Spinach. Leafy greens, including kale, chard, collard greens, and mustard greens. Beets. Cauliflower. Cabbage. Broccoli. Carrots. Green beans.Tomatoes. Peppers. Onions. Cucumbers. Brussels sprouts. Grains Whole grains, such as whole-wheat or whole-grain bread, crackers, tortillas,cereal, and pasta. Unsweetened oatmeal. Quinoa. Brown or wild rice. Meats and other proteins Seafood. Poultry without skin. Lean cuts of poultry and beef. Tofu. Nuts. Seeds. Dairy Low-fat or fat-free dairy products such as milk, yogurt, and cheese. The items listed above may not be a complete list of foods and beverages you can eat. Contact a dietitian for more information. What foods should I avoid? Fruits Fruits canned with syrup. Vegetables Canned vegetables. Frozen vegetables with butter or cream sauce. Grains Refined white flour and flour products such as bread, pasta, snack foods, andcereals. Avoid all processed foods. Meats and other proteins Fatty cuts of meat. Poultry with skin. Breaded or fried meats. Processed meat.Avoid saturated fats. Dairy Full-fat yogurt, cheese, or milk. Beverages Sweetened drinks, such as soda or iced tea. The items listed above may not be a complete list of foods and beverages you should avoid. Contact a dietitian for more information. Questions to ask a health care  provider Do I need to meet with a diabetes educator? Do I need to meet with a dietitian? What number can I call if I have questions? When are the best times to check my blood glucose? Where to find more  information: American Diabetes Association: diabetes.org Academy of Nutrition and Dietetics: www.eatright.[employer] of Diabetes and Digestive and Kidney Diseases: CarFli[employer].tn Association of Diabetes Care and Education Specialists: www.diabeteseducator.org Summary It is important to have healthy eating habits because your blood sugar (glucose) levels are greatly affected by what you eat and drink. A healthy meal plan will help you control your blood glucose and maintain a healthy lifestyle. Your health care provider may recommend that you work with a dietitian to make a meal plan that is best for you. Keep in mind that carbohydrates (carbs) and alcohol have immediate effects on your blood glucose levels. It is important to count carbs and to use alcohol carefully. This information is not intended to replace advice given to you by your health care provider. Make sure you discuss any questions you have with your healthcare provider. Document Revised: 08/20/2019 Document Reviewed: 08/20/2019 Elsevier Patient Education  2021 Elsevier Inc.   Edwina Barth, MD [facility] at [facility]

## 2021-06-17 DIAGNOSIS — H40013 Open angle with borderline findings, low risk, bilateral: Secondary | ICD-10-CM | POA: Diagnosis not present

## 2021-06-22 ENCOUNTER — Encounter: Payer: Self-pay | Admitting: Emergency Medicine

## 2021-06-22 ENCOUNTER — Other Ambulatory Visit: Payer: Self-pay

## 2021-06-22 ENCOUNTER — Ambulatory Visit (INDEPENDENT_AMBULATORY_CARE_PROVIDER_SITE_OTHER): Payer: BC Managed Care – PPO | Admitting: Emergency Medicine

## 2021-06-22 VITALS — BP 124/78 | HR 75 | Temp 98.5°F | Ht 67.0 in | Wt 191.0 lb

## 2021-06-22 DIAGNOSIS — E1169 Type 2 diabetes mellitus with other specified complication: Secondary | ICD-10-CM

## 2021-06-22 DIAGNOSIS — E785 Hyperlipidemia, unspecified: Secondary | ICD-10-CM

## 2021-06-22 LAB — POCT GLYCOSYLATED HEMOGLOBIN (HGB A1C): Hemoglobin A1C: 8 % — AB (ref 4.0–5.6)

## 2021-06-22 MED ORDER — EMPAGLIFLOZIN 10 MG PO TABS: 10.0000 mg | ORAL_TABLET | Freq: Every day | ORAL | 3 refills | Status: AC

## 2021-06-22 NOTE — Progress Notes (Signed)
Oscar Fernandez 45 y.o.   Chief Complaint  Patient presents with   Diabetes    F/u    HISTORY OF PRESENT ILLNESS: This is a 45 y.o. male here for diabetes follow-up. Presently on weekly Trulicity 3 mg, metformin 1000 mg twice a day. Also has history of dyslipidemia on rosuvastatin 20 mg daily. No complaints or medical concerns today.  Diabetes Pertinent negatives for hypoglycemia include no dizziness or headaches. Pertinent negatives for diabetes include no chest pain.    Prior to Admission medications   Medication Sig Start Date End Date Taking? Authorizing Provider  aspirin EC 81 MG tablet Take 1 tablet (81 mg total) by mouth daily. 04/18/14  Yes Sherren Mocha, MD  Dulaglutide (TRULICITY) 3 MG/0.5ML SOPN Inject 3 mg as directed once a week. 03/22/21  Yes Georgina Quint, MD  metFORMIN (GLUCOPHAGE) 1000 MG tablet Take 1 tablet (1,000 mg total) by mouth 2 (two) times daily with a meal. 03/22/21 06/22/21 Yes Lazaro Isenhower, Eilleen Kempf, MD  rosuvastatin (CRESTOR) 20 MG tablet Take 1 tablet (20 mg total) by mouth daily. 03/22/21  Yes Georgina Quint, MD    Not on File  Patient Active Problem List   Diagnosis Date Noted   Dyslipidemia associated with type 2 diabetes mellitus (HCC) 04/19/2014   Hyperlipidemia LDL goal <100 04/19/2014    Past Medical History:  Diagnosis Date   Diabetes mellitus without complication (HCC)    Hyperlipidemia     No past surgical history on file.  Social History   Socioeconomic History   Marital status: Married    Spouse name: Building surveyor   Number of children: 4   Years of education: college   Highest education level: Not on file  Occupational History   Occupation: industrial  Tobacco Use   Smoking status: Never   Smokeless tobacco: Never  Vaping Use   Vaping Use: Never used  Substance and Sexual Activity   Alcohol use: No   Drug use: No   Sexual activity: Yes  Other Topics Concern   Not on file  Social History Narrative   Not  on file   Social Determinants of Health   Financial Resource Strain: Not on file  Food Insecurity: Not on file  Transportation Needs: Not on file  Physical Activity: Not on file  Stress: Not on file  Social Connections: Not on file  Intimate Partner Violence: Not on file    No family history on file.   Review of Systems  Constitutional: Negative.  Negative for chills and fever.  HENT: Negative.  Negative for congestion and sore throat.   Respiratory: Negative.  Negative for cough and shortness of breath.   Cardiovascular: Negative.  Negative for chest pain and palpitations.  Gastrointestinal: Negative.  Negative for abdominal pain, diarrhea, nausea and vomiting.  Genitourinary: Negative.  Negative for dysuria and hematuria.  Skin: Negative.  Negative for rash.  Neurological: Negative.  Negative for dizziness and headaches.  All other systems reviewed and are negative.  Today's Vitals   06/22/21 1611  BP: 124/78  Pulse: 75  Temp: 98.5 F (36.9 C)  TempSrc: Oral  SpO2: 98%  Weight: 191 lb (86.6 kg)  Height: 5\' 7"  (1.702 m)   Body mass index is 29.91 kg/m. Wt Readings from Last 3 Encounters:  06/22/21 191 lb (86.6 kg)  03/22/21 190 lb 6.4 oz (86.4 kg)  02/03/20 192 lb 9.6 oz (87.4 kg)    Physical Exam Vitals reviewed.  Constitutional:  Appearance: Normal appearance. He is obese.  HENT:     Head: Normocephalic.  Eyes:     Extraocular Movements: Extraocular movements intact.     Conjunctiva/sclera: Conjunctivae normal.     Pupils: Pupils are equal, round, and reactive to light.  Cardiovascular:     Rate and Rhythm: Normal rate and regular rhythm.     Pulses: Normal pulses.     Heart sounds: Normal heart sounds.  Pulmonary:     Effort: Pulmonary effort is normal.     Breath sounds: Normal breath sounds.  Musculoskeletal:        General: Normal range of motion.     Cervical back: Normal range of motion and neck supple.  Skin:    General: Skin is warm  and dry.  Neurological:     General: No focal deficit present.     Mental Status: He is alert and oriented to person, place, and time.  Psychiatric:        Mood and Affect: Mood normal.        Behavior: Behavior normal.    Results for orders placed or performed in visit on 06/22/21 (from the past 24 hour(s))  POCT glycosylated hemoglobin (Hb A1C)     Status: Abnormal   Collection Time: 06/22/21  4:26 PM  Result Value Ref Range   Hemoglobin A1C 8.0 (A) 4.0 - 5.6 %   HbA1c POC (<> result, manual entry)     HbA1c, POC (prediabetic range)     HbA1c, POC (controlled diabetic range)      ASSESSMENT & PLAN: Hermilo was seen today for diabetes.  Diagnoses and all orders for this visit:  Dyslipidemia associated with type 2 diabetes mellitus (HCC) -     POCT glycosylated hemoglobin (Hb A1C) -     empagliflozin (JARDIANCE) 10 MG TABS tablet; Take 1 tablet (10 mg total) by mouth daily before breakfast.   Dyslipidemia associated with type 2 diabetes mellitus (HCC) Uncontrolled diabetes with hemoglobin A1c 8.0. Continue Trulicity 3 mg weekly and metformin 1000 mg twice a day. Start Jardiance 10 mg daily. Lab Results  Component Value Date   CREATININE 0.87 02/03/2020   BUN 17 02/03/2020   NA 140 02/03/2020   K 4.7 02/03/2020   CL 104 02/03/2020   CO2 22 02/03/2020  Diet and nutrition discussed. Continue rosuvastatin 20 mg daily. Follow-up in 3 months.  Patient Instructions  Diabetes Mellitus and Nutrition, Adult When you have diabetes, or diabetes mellitus, it is very important to have healthy eating habits because your blood sugar (glucose) levels are greatly affected by what you eat and drink. Eating healthy foods in the right amounts, at about the same times every day, can help you: Control your blood glucose. Lower your risk of heart disease. Improve your blood pressure. Reach or maintain a healthy weight. What can affect my meal plan? Every person with diabetes is  different, and each person has different needs for a meal plan. Your health care provider may recommend that you work with a dietitian to make a meal plan that is best for you. Your meal plan may vary depending on factors such as: The calories you need. The medicines you take. Your weight. Your blood glucose, blood pressure, and cholesterol levels. Your activity level. Other health conditions you have, such as heart or kidney disease. How do carbohydrates affect me? Carbohydrates, also called carbs, affect your blood glucose level more than any other type of food. Eating carbs naturally raises the amount  of glucose in your blood. Carb counting is a method for keeping track of how many carbs you eat. Counting carbs is important to keep your blood glucose at a healthy level, especially if you use insulin or take certain oral diabetes medicines. It is important to know how many carbs you can safely have in each meal. This is different for every person. Your dietitian can help you calculate how many carbs you should have at each meal and for each snack. How does alcohol affect me? Alcohol can cause a sudden decrease in blood glucose (hypoglycemia), especially if you use insulin or take certain oral diabetes medicines. Hypoglycemia can be a life-threatening condition. Symptoms of hypoglycemia, such as sleepiness, dizziness, and confusion, are similar to symptoms of having too much alcohol. Do not drink alcohol if: Your health care provider tells you not to drink. You are pregnant, may be pregnant, or are planning to become pregnant. If you drink alcohol: Do not drink on an empty stomach. Limit how much you use to: 0-1 drink a day for women. 0-2 drinks a day for men. Be aware of how much alcohol is in your drink. In the [place] one drink equals one 12 oz bottle of beer (355 mL), one 5 oz glass of wine (148 mL), or one 1 oz glass of hard liquor (44 mL). Keep yourself hydrated with water, diet soda, or  unsweetened iced tea. Keep in mind that regular soda, juice, and other mixers may contain a lot of sugar and must be counted as carbs. What are tips for following this plan? Reading food labels Start by checking the serving size on the "Nutrition Facts" label of packaged foods and drinks. The amount of calories, carbs, fats, and other nutrients listed on the label is based on one serving of the item. Many items contain more than one serving per package. Check the total grams (g) of carbs in one serving. You can calculate the number of servings of carbs in one serving by dividing the total carbs by 15. For example, if a food has 30 g of total carbs per serving, it would be equal to 2 servings of carbs. Check the number of grams (g) of saturated fats and trans fats in one serving. Choose foods that have a low amount or none of these fats. Check the number of milligrams (mg) of salt (sodium) in one serving. Most people should limit total sodium intake to less than 2,300 mg per day. Always check the nutrition information of foods labeled as "low-fat" or "nonfat." These foods may be higher in added sugar or refined carbs and should be avoided. Talk to your dietitian to identify your daily goals for nutrients listed on the label. Shopping Avoid buying canned, pre-made, or processed foods. These foods tend to be high in fat, sodium, and added sugar. Shop around the outside edge of the grocery store. This is where you will most often find fresh fruits and vegetables, bulk grains, fresh meats, and fresh dairy. Cooking Use low-heat cooking methods, such as baking, instead of high-heat cooking methods like deep frying. Cook using healthy oils, such as olive, canola, or sunflower oil. Avoid cooking with butter, cream, or high-fat meats. Meal planning Eat meals and snacks regularly, preferably at the same times every day. Avoid going long periods of time without eating. Eat foods that are high in fiber, such  as fresh fruits, vegetables, beans, and whole grains. Talk with your dietitian about how many servings of carbs you can  eat at each meal. Eat 4-6 oz (112-168 g) of lean protein each day, such as lean meat, chicken, fish, eggs, or tofu. One ounce (oz) of lean protein is equal to: 1 oz (28 g) of meat, chicken, or fish. 1 egg.  cup (62 g) of tofu. Eat some foods each day that contain healthy fats, such as avocado, nuts, seeds, and fish. What foods should I eat? Fruits Berries. Apples. Oranges. Peaches. Apricots. Plums. Grapes. Mango. Papaya. Pomegranate. Kiwi. Cherries. Vegetables Lettuce. Spinach. Leafy greens, including kale, chard, collard greens, and mustard greens. Beets. Cauliflower. Cabbage. Broccoli. Carrots. Green beans. Tomatoes. Peppers. Onions. Cucumbers. Brussels sprouts. Grains Whole grains, such as whole-wheat or whole-grain bread, crackers, tortillas, cereal, and pasta. Unsweetened oatmeal. Quinoa. Brown or wild rice. Meats and other proteins Seafood. Poultry without skin. Lean cuts of poultry and beef. Tofu. Nuts. Seeds. Dairy Low-fat or fat-free dairy products such as milk, yogurt, and cheese. The items listed above may not be a complete list of foods and beverages you can eat. Contact a dietitian for more information. What foods should I avoid? Fruits Fruits canned with syrup. Vegetables Canned vegetables. Frozen vegetables with butter or cream sauce. Grains Refined white flour and flour products such as bread, pasta, snack foods, and cereals. Avoid all processed foods. Meats and other proteins Fatty cuts of meat. Poultry with skin. Breaded or fried meats. Processed meat. Avoid saturated fats. Dairy Full-fat yogurt, cheese, or milk. Beverages Sweetened drinks, such as soda or iced tea. The items listed above may not be a complete list of foods and beverages you should avoid. Contact a dietitian for more information. Questions to ask a health care provider Do I need  to meet with a diabetes educator? Do I need to meet with a dietitian? What number can I call if I have questions? When are the best times to check my blood glucose? Where to find more information: American Diabetes Association: diabetes.org Academy of Nutrition and Dietetics: www.eatright.[employer] of Diabetes and Digestive and Kidney Diseases: CarFli[employer].tn Association of Diabetes Care and Education Specialists: www.diabeteseducator.org Summary It is important to have healthy eating habits because your blood sugar (glucose) levels are greatly affected by what you eat and drink. A healthy meal plan will help you control your blood glucose and maintain a healthy lifestyle. Your health care provider may recommend that you work with a dietitian to make a meal plan that is best for you. Keep in mind that carbohydrates (carbs) and alcohol have immediate effects on your blood glucose levels. It is important to count carbs and to use alcohol carefully. This information is not intended to replace advice given to you by your health care provider. Make sure you discuss any questions you have with your health care provider. Document Revised: 08/20/2019 Document Reviewed: 08/20/2019 Elsevier Patient Education  2021 Elsevier Inc.   Edwina Barth, MD  [facility] at [facility]

## 2021-06-22 NOTE — Assessment & Plan Note (Signed)
Uncontrolled diabetes with hemoglobin A1c 8.0. Continue Trulicity 3 mg weekly and metformin 1000 mg twice a day. Start Jardiance 10 mg daily. Lab Results  Component Value Date   CREATININE 0.87 02/03/2020   BUN 17 02/03/2020   NA 140 02/03/2020   K 4.7 02/03/2020   CL 104 02/03/2020   CO2 22 02/03/2020  Diet and nutrition discussed. Continue rosuvastatin 20 mg daily. Follow-up in 3 months.

## 2021-06-22 NOTE — Patient Instructions (Signed)
Diabetes Mellitus and Nutrition, Adult When you have diabetes, or diabetes mellitus, it is very important to have healthy eating habits because your blood sugar (glucose) levels are greatly affected by what you eat and drink. Eating healthy foods in the right amounts, at about the same times every day, can help you:  Control your blood glucose.  Lower your risk of heart disease.  Improve your blood pressure.  Reach or maintain a healthy weight. What can affect my meal plan? Every person with diabetes is different, and each person has different needs for a meal plan. Your health care provider may recommend that you work with a dietitian to make a meal plan that is best for you. Your meal plan may vary depending on factors such as:  The calories you need.  The medicines you take.  Your weight.  Your blood glucose, blood pressure, and cholesterol levels.  Your activity level.  Other health conditions you have, such as heart or kidney disease. How do carbohydrates affect me? Carbohydrates, also called carbs, affect your blood glucose level more than any other type of food. Eating carbs naturally raises the amount of glucose in your blood. Carb counting is a method for keeping track of how many carbs you eat. Counting carbs is important to keep your blood glucose at a healthy level, especially if you use insulin or take certain oral diabetes medicines. It is important to know how many carbs you can safely have in each meal. This is different for every person. Your dietitian can help you calculate how many carbs you should have at each meal and for each snack. How does alcohol affect me? Alcohol can cause a sudden decrease in blood glucose (hypoglycemia), especially if you use insulin or take certain oral diabetes medicines. Hypoglycemia can be a life-threatening condition. Symptoms of hypoglycemia, such as sleepiness, dizziness, and confusion, are similar to symptoms of having too much  alcohol.  Do not drink alcohol if: ? Your health care provider tells you not to drink. ? You are pregnant, may be pregnant, or are planning to become pregnant.  If you drink alcohol: ? Do not drink on an empty stomach. ? Limit how much you use to:  0-1 drink a day for women.  0-2 drinks a day for men. ? Be aware of how much alcohol is in your drink. In the [place] one drink equals one 12 oz bottle of beer (355 mL), one 5 oz glass of wine (148 mL), or one 1 oz glass of hard liquor (44 mL). ? Keep yourself hydrated with water, diet soda, or unsweetened iced tea.  Keep in mind that regular soda, juice, and other mixers may contain a lot of sugar and must be counted as carbs. What are tips for following this plan? Reading food labels  Start by checking the serving size on the "Nutrition Facts" label of packaged foods and drinks. The amount of calories, carbs, fats, and other nutrients listed on the label is based on one serving of the item. Many items contain more than one serving per package.  Check the total grams (g) of carbs in one serving. You can calculate the number of servings of carbs in one serving by dividing the total carbs by 15. For example, if a food has 30 g of total carbs per serving, it would be equal to 2 servings of carbs.  Check the number of grams (g) of saturated fats and trans fats in one serving. Choose foods that have   a low amount or none of these fats.  Check the number of milligrams (mg) of salt (sodium) in one serving. Most people should limit total sodium intake to less than 2,300 mg per day.  Always check the nutrition information of foods labeled as "low-fat" or "nonfat." These foods may be higher in added sugar or refined carbs and should be avoided.  Talk to your dietitian to identify your daily goals for nutrients listed on the label. Shopping  Avoid buying canned, pre-made, or processed foods. These foods tend to be high in fat, sodium, and added  sugar.  Shop around the outside edge of the grocery store. This is where you will most often find fresh fruits and vegetables, bulk grains, fresh meats, and fresh dairy. Cooking  Use low-heat cooking methods, such as baking, instead of high-heat cooking methods like deep frying.  Cook using healthy oils, such as olive, canola, or sunflower oil.  Avoid cooking with butter, cream, or high-fat meats. Meal planning  Eat meals and snacks regularly, preferably at the same times every day. Avoid going long periods of time without eating.  Eat foods that are high in fiber, such as fresh fruits, vegetables, beans, and whole grains. Talk with your dietitian about how many servings of carbs you can eat at each meal.  Eat 4-6 oz (112-168 g) of lean protein each day, such as lean meat, chicken, fish, eggs, or tofu. One ounce (oz) of lean protein is equal to: ? 1 oz (28 g) of meat, chicken, or fish. ? 1 egg. ?  cup (62 g) of tofu.  Eat some foods each day that contain healthy fats, such as avocado, nuts, seeds, and fish.   What foods should I eat? Fruits Berries. Apples. Oranges. Peaches. Apricots. Plums. Grapes. Mango. Papaya. Pomegranate. Kiwi. Cherries. Vegetables Lettuce. Spinach. Leafy greens, including kale, chard, collard greens, and mustard greens. Beets. Cauliflower. Cabbage. Broccoli. Carrots. Green beans. Tomatoes. Peppers. Onions. Cucumbers. Brussels sprouts. Grains Whole grains, such as whole-wheat or whole-grain bread, crackers, tortillas, cereal, and pasta. Unsweetened oatmeal. Quinoa. Brown or wild rice. Meats and other proteins Seafood. Poultry without skin. Lean cuts of poultry and beef. Tofu. Nuts. Seeds. Dairy Low-fat or fat-free dairy products such as milk, yogurt, and cheese. The items listed above may not be a complete list of foods and beverages you can eat. Contact a dietitian for more information. What foods should I avoid? Fruits Fruits canned with  syrup. Vegetables Canned vegetables. Frozen vegetables with butter or cream sauce. Grains Refined white flour and flour products such as bread, pasta, snack foods, and cereals. Avoid all processed foods. Meats and other proteins Fatty cuts of meat. Poultry with skin. Breaded or fried meats. Processed meat. Avoid saturated fats. Dairy Full-fat yogurt, cheese, or milk. Beverages Sweetened drinks, such as soda or iced tea. The items listed above may not be a complete list of foods and beverages you should avoid. Contact a dietitian for more information. Questions to ask a health care provider  Do I need to meet with a diabetes educator?  Do I need to meet with a dietitian?  What number can I call if I have questions?  When are the best times to check my blood glucose? Where to find more information:  American Diabetes Association: diabetes.org  Academy of Nutrition and Dietetics: www.eatright.org  National Institute of Diabetes and Digestive and Kidney Diseases: www.niddk.nih.gov  Association of Diabetes Care and Education Specialists: www.diabeteseducator.org Summary  It is important to have healthy eating   habits because your blood sugar (glucose) levels are greatly affected by what you eat and drink.  A healthy meal plan will help you control your blood glucose and maintain a healthy lifestyle.  Your health care provider may recommend that you work with a dietitian to make a meal plan that is best for you.  Keep in mind that carbohydrates (carbs) and alcohol have immediate effects on your blood glucose levels. It is important to count carbs and to use alcohol carefully. This information is not intended to replace advice given to you by your health care provider. Make sure you discuss any questions you have with your health care provider. Document Revised: 08/20/2019 Document Reviewed: 08/20/2019 Elsevier Patient Education  2021 Elsevier Inc.  

## 2021-09-22 ENCOUNTER — Ambulatory Visit: Payer: BC Managed Care – PPO | Admitting: Emergency Medicine

## 2021-09-30 ENCOUNTER — Encounter: Payer: Self-pay | Admitting: Emergency Medicine

## 2021-09-30 ENCOUNTER — Other Ambulatory Visit: Payer: Self-pay

## 2021-09-30 ENCOUNTER — Ambulatory Visit (INDEPENDENT_AMBULATORY_CARE_PROVIDER_SITE_OTHER): Payer: BC Managed Care – PPO | Admitting: Emergency Medicine

## 2021-09-30 VITALS — BP 122/78 | HR 71 | Temp 98.4°F | Ht 67.0 in | Wt 187.0 lb

## 2021-09-30 DIAGNOSIS — E785 Hyperlipidemia, unspecified: Secondary | ICD-10-CM

## 2021-09-30 DIAGNOSIS — E1169 Type 2 diabetes mellitus with other specified complication: Secondary | ICD-10-CM

## 2021-09-30 LAB — POCT GLYCOSYLATED HEMOGLOBIN (HGB A1C): Hemoglobin A1C: 7.2 % — AB (ref 4.0–5.6)

## 2021-09-30 NOTE — Patient Instructions (Signed)

## 2021-09-30 NOTE — Assessment & Plan Note (Signed)
Improved diabetes with hemoglobin A1c better than before at 7.2. Continue weekly Trulicity 3 mg, metformin 1000 mg twice a day and daily Jardiance 10 mg. Diet and nutrition discussed. Continue rosuvastatin 20 mg daily. Follow-up in 3 months.

## 2021-09-30 NOTE — Progress Notes (Signed)
Oscar Fernandez 46 y.o.   Chief Complaint  Patient presents with   Diabetes    F /u    HISTORY OF PRESENT ILLNESS: This is a 46 y.o. male here for diabetes follow-up. Last office visit assessment and plan as follows: Dyslipidemia associated with type 2 diabetes mellitus ([place]) Uncontrolled diabetes with hemoglobin A1c 8.0. Continue Trulicity 3 mg weekly and metformin 1000 mg twice a day. Start Jardiance 10 mg daily.      Lab Results  Component Value Date    CREATININE 0.87 02/03/2020    BUN 17 02/03/2020    NA 140 02/03/2020    K 4.7 02/03/2020    CL 104 02/03/2020    CO2 22 02/03/2020  Diet and nutrition discussed. Continue rosuvastatin 20 mg daily. Follow-up in 3 months. Taking medications as prescribed.  Feels better. Has no complaints or medical concerns today.  Diabetes Pertinent negatives for hypoglycemia include no dizziness or headaches. Pertinent negatives for diabetes include no chest pain.    Prior to Admission medications   Medication Sig Start Date End Date Taking? Authorizing Provider  aspirin EC 81 MG tablet Take 1 tablet (81 mg total) by mouth daily. 04/18/14  Yes Shawnee Knapp, MD  Dulaglutide (TRULICITY) 3 0000000 SOPN Inject 3 mg as directed once a week. 03/22/21  Yes Horald Pollen, MD  metFORMIN (GLUCOPHAGE) 1000 MG tablet Take 1 tablet (1,000 mg total) by mouth 2 (two) times daily with a meal. 03/22/21 09/30/21 Yes Mee Macdonnell, Ines Bloomer, MD  rosuvastatin (CRESTOR) 20 MG tablet Take 1 tablet (20 mg total) by mouth daily. 03/22/21  Yes Horald Pollen, MD    Not on File  Patient Active Problem List   Diagnosis Date Noted   Dyslipidemia associated with type 2 diabetes mellitus ([place]) 04/19/2014   Hyperlipidemia LDL goal <100 04/19/2014    Past Medical History:  Diagnosis Date   Diabetes mellitus without complication ([place])    Hyperlipidemia     No past surgical history on file.  Social History   Socioeconomic History   Marital status:  Married    Spouse name: Social research officer, government   Number of children: 4   Years of education: college   Highest education level: Not on file  Occupational History   Occupation: industrial  Tobacco Use   Smoking status: Never   Smokeless tobacco: Never  Vaping Use   Vaping Use: Never used  Substance and Sexual Activity   Alcohol use: No   Drug use: No   Sexual activity: Yes  Other Topics Concern   Not on file  Social History Narrative   Not on file   Social Determinants of Health   Financial Resource Strain: Not on file  Food Insecurity: Not on file  Transportation Needs: Not on file  Physical Activity: Not on file  Stress: Not on file  Social Connections: Not on file  Intimate Partner Violence: Not on file    No family history on file.   Review of Systems  Constitutional: Negative.  Negative for chills and fever.  HENT: Negative.  Negative for congestion and sore throat.   Respiratory: Negative.  Negative for cough and shortness of breath.   Cardiovascular: Negative.  Negative for chest pain and palpitations.  Gastrointestinal:  Negative for abdominal pain, diarrhea, nausea and vomiting.  Genitourinary: Negative.  Negative for dysuria and hematuria.  Musculoskeletal: Negative.   Skin: Negative.  Negative for rash.  Neurological: Negative.  Negative for dizziness and headaches.  All other systems  reviewed and are negative.  Today's Vitals   09/30/21 1524  BP: 122/78  Pulse: 71  Temp: 98.4 F (36.9 C)  TempSrc: Oral  SpO2: 99%  Weight: 187 lb (84.8 kg)  Height: 5\' 7"  (1.702 m)   Body mass index is 29.29 kg/m.  Physical Exam Vitals reviewed.  Constitutional:      Appearance: Normal appearance.  HENT:     Head: Normocephalic.  Eyes:     Extraocular Movements: Extraocular movements intact.     Pupils: Pupils are equal, round, and reactive to light.  Cardiovascular:     Rate and Rhythm: Normal rate and regular rhythm.     Pulses: Normal pulses.     Heart  sounds: Normal heart sounds.  Pulmonary:     Effort: Pulmonary effort is normal.     Breath sounds: Normal breath sounds.  Abdominal:     Palpations: Abdomen is soft.     Tenderness: There is no abdominal tenderness.  Musculoskeletal:     Cervical back: No tenderness.  Lymphadenopathy:     Cervical: No cervical adenopathy.  Skin:    General: Skin is warm and dry.     Capillary Refill: Capillary refill takes less than 2 seconds.  Neurological:     General: No focal deficit present.     Mental Status: He is alert and oriented to person, place, and time.  Psychiatric:        Mood and Affect: Mood normal.        Behavior: Behavior normal.   Results for orders placed or performed in visit on 09/30/21 (from the past 24 hour(s))  POCT glycosylated hemoglobin (Hb A1C)     Status: Abnormal   Collection Time: 09/30/21  3:25 PM  Result Value Ref Range   Hemoglobin A1C 7.2 (A) 4.0 - 5.6 %   HbA1c POC (<> result, manual entry)     HbA1c, POC (prediabetic range)     HbA1c, POC (controlled diabetic range)       ASSESSMENT & PLAN: Problem List Items Addressed This Visit       Endocrine   Dyslipidemia associated with type 2 diabetes mellitus ([place]) - Primary    Improved diabetes with hemoglobin A1c better than before at 7.2. Continue weekly Trulicity 3 mg, metformin 1000 mg twice a day and daily Jardiance 10 mg. Diet and nutrition discussed. Continue rosuvastatin 20 mg daily. Follow-up in 3 months.      Relevant Orders   POCT glycosylated hemoglobin (Hb A1C) (Completed)   Patient Instructions  Diabetes Mellitus and Nutrition, Adult When you have diabetes, or diabetes mellitus, it is very important to have healthy eating habits because your blood sugar (glucose) levels are greatly affected by what you eat and drink. Eating healthy foods in the right amounts, at about the same times every day, can help you: Manage your blood glucose. Lower your risk of heart disease. Improve your  blood pressure. Reach or maintain a healthy weight. What can affect my meal plan? Every person with diabetes is different, and each person has different needs for a meal plan. Your health care provider may recommend that you work with a dietitian to make a meal plan that is best for you. Your meal plan may vary depending on factors such as: The calories you need. The medicines you take. Your weight. Your blood glucose, blood pressure, and cholesterol levels. Your activity level. Other health conditions you have, such as heart or kidney disease. How do carbohydrates affect me?  Carbohydrates, also called carbs, affect your blood glucose level more than any other type of food. Eating carbs raises the amount of glucose in your blood. It is important to know how many carbs you can safely have in each meal. This is different for every person. Your dietitian can help you calculate how many carbs you should have at each meal and for each snack. How does alcohol affect me? Alcohol can cause a decrease in blood glucose (hypoglycemia), especially if you use insulin or take certain diabetes medicines by mouth. Hypoglycemia can be a life-threatening condition. Symptoms of hypoglycemia, such as sleepiness, dizziness, and confusion, are similar to symptoms of having too much alcohol. Do not drink alcohol if: Your health care provider tells you not to drink. You are pregnant, may be pregnant, or are planning to become pregnant. If you drink alcohol: Limit how much you have to: 0-1 drink a day for women. 0-2 drinks a day for men. Know how much alcohol is in your drink. In the [place] one drink equals one 12 oz bottle of beer (355 mL), one 5 oz glass of wine (148 mL), or one 1 oz glass of hard liquor (44 mL). Keep yourself hydrated with water, diet soda, or unsweetened iced tea. Keep in mind that regular soda, juice, and other mixers may contain a lot of sugar and must be counted as carbs. What are tips for  following this plan? Reading food labels Start by checking the serving size on the Nutrition Facts label of packaged foods and drinks. The number of calories and the amount of carbs, fats, and other nutrients listed on the label are based on one serving of the item. Many items contain more than one serving per package. Check the total grams (g) of carbs in one serving. Check the number of grams of saturated fats and trans fats in one serving. Choose foods that have a low amount or none of these fats. Check the number of milligrams (mg) of salt (sodium) in one serving. Most people should limit total sodium intake to less than 2,300 mg per day. Always check the nutrition information of foods labeled as "low-fat" or "nonfat." These foods may be higher in added sugar or refined carbs and should be avoided. Talk to your dietitian to identify your daily goals for nutrients listed on the label. Shopping Avoid buying canned, pre-made, or processed foods. These foods tend to be high in fat, sodium, and added sugar. Shop around the outside edge of the grocery store. This is where you will most often find fresh fruits and vegetables, bulk grains, fresh meats, and fresh dairy products. Cooking Use low-heat cooking methods, such as baking, instead of high-heat cooking methods, such as deep frying. Cook using healthy oils, such as olive, canola, or sunflower oil. Avoid cooking with butter, cream, or high-fat meats. Meal planning Eat meals and snacks regularly, preferably at the same times every day. Avoid going long periods of time without eating. Eat foods that are high in fiber, such as fresh fruits, vegetables, beans, and whole grains. Eat 4-6 oz (112-168 g) of lean protein each day, such as lean meat, chicken, fish, eggs, or tofu. One ounce (oz) (28 g) of lean protein is equal to: 1 oz (28 g) of meat, chicken, or fish. 1 egg.  cup (62 g) of tofu. Eat some foods each day that contain healthy fats, such  as avocado, nuts, seeds, and fish. What foods should I eat? Fruits Berries. Apples. Oranges. Peaches. Apricots.  Plums. Grapes. Mangoes. Papayas. Pomegranates. Kiwi. Cherries. Vegetables Leafy greens, including lettuce, spinach, kale, chard, collard greens, mustard greens, and cabbage. Beets. Cauliflower. Broccoli. Carrots. Green beans. Tomatoes. Peppers. Onions. Cucumbers. Brussels sprouts. Grains Whole grains, such as whole-wheat or whole-grain bread, crackers, tortillas, cereal, and pasta. Unsweetened oatmeal. Quinoa. Brown or wild rice. Meats and other proteins Seafood. Poultry without skin. Lean cuts of poultry and beef. Tofu. Nuts. Seeds. Dairy Low-fat or fat-free dairy products such as milk, yogurt, and cheese. The items listed above may not be a complete list of foods and beverages you can eat and drink. Contact a dietitian for more information. What foods should I avoid? Fruits Fruits canned with syrup. Vegetables Canned vegetables. Frozen vegetables with butter or cream sauce. Grains Refined white flour and flour products such as bread, pasta, snack foods, and cereals. Avoid all processed foods. Meats and other proteins Fatty cuts of meat. Poultry with skin. Breaded or fried meats. Processed meat. Avoid saturated fats. Dairy Full-fat yogurt, cheese, or milk. Beverages Sweetened drinks, such as soda or iced tea. The items listed above may not be a complete list of foods and beverages you should avoid. Contact a dietitian for more information. Questions to ask a health care provider Do I need to meet with a certified diabetes care and education specialist? Do I need to meet with a dietitian? What number can I call if I have questions? When are the best times to check my blood glucose? Where to find more information: American Diabetes Association: diabetes.org Academy of Nutrition and Dietetics: eatright.[employer] of Diabetes and Digestive and Kidney Diseases:  [employer] Association of Diabetes Care & Education Specialists: diabeteseducator.org Summary It is important to have healthy eating habits because your blood sugar (glucose) levels are greatly affected by what you eat and drink. It is important to use alcohol carefully. A healthy meal plan will help you manage your blood glucose and lower your risk of heart disease. Your health care provider may recommend that you work with a dietitian to make a meal plan that is best for you. This information is not intended to replace advice given to you by your health care provider. Make sure you discuss any questions you have with your health care provider. Document Revised: 04/15/2020 Document Reviewed: 04/15/2020 Elsevier Patient Education  2022 [place], MD [facility]

## 2021-11-25 ENCOUNTER — Telehealth: Payer: Self-pay | Admitting: Emergency Medicine

## 2021-11-25 NOTE — Telephone Encounter (Signed)
Patient calling in ? ?Patient says he is applying for a job & needs a medical letter from provider stating that he is in good physical standing to work  ? ?Please let patient know when letter ready for pick up [phone] ?

## 2021-11-29 NOTE — Telephone Encounter (Signed)
Yes.  Thank you.

## 2021-11-29 NOTE — Telephone Encounter (Signed)
Composed note for patient's employer, called pt to pick up. ?

## 2022-01-04 ENCOUNTER — Ambulatory Visit: Payer: BC Managed Care – PPO | Admitting: Emergency Medicine

## 2022-01-17 ENCOUNTER — Encounter: Payer: Self-pay | Admitting: Emergency Medicine

## 2022-01-17 ENCOUNTER — Ambulatory Visit (INDEPENDENT_AMBULATORY_CARE_PROVIDER_SITE_OTHER): Payer: BC Managed Care – PPO | Admitting: Emergency Medicine

## 2022-01-17 VITALS — BP 108/76 | HR 87 | Temp 97.7°F | Ht 67.0 in | Wt 184.2 lb

## 2022-01-17 DIAGNOSIS — E785 Hyperlipidemia, unspecified: Secondary | ICD-10-CM

## 2022-01-17 DIAGNOSIS — E1169 Type 2 diabetes mellitus with other specified complication: Secondary | ICD-10-CM | POA: Diagnosis not present

## 2022-01-17 LAB — MICROALBUMIN / CREATININE URINE RATIO
Creatinine,U: 265.3 mg/dL
Microalb Creat Ratio: 0.9 mg/g (ref 0.0–30.0)
Microalb, Ur: 2.4 mg/dL — ABNORMAL HIGH (ref 0.0–1.9)

## 2022-01-17 LAB — COMPREHENSIVE METABOLIC PANEL
ALT: 14 U/L (ref 0–53)
AST: 14 U/L (ref 0–37)
Albumin: 4.1 g/dL (ref 3.5–5.2)
Alkaline Phosphatase: 48 U/L (ref 39–117)
BUN: 25 mg/dL — ABNORMAL HIGH (ref 6–23)
CO2: 27 mEq/L (ref 19–32)
Calcium: 9.2 mg/dL (ref 8.4–10.5)
Chloride: 104 mEq/L (ref 96–112)
Creatinine, Ser: 0.76 mg/dL (ref 0.40–1.50)
GFR: 107.94 mL/min (ref >60.00)
Glucose, Bld: 192 mg/dL — ABNORMAL HIGH (ref 70–99)
Potassium: 3.8 mEq/L (ref 3.5–5.1)
Sodium: 138 mEq/L (ref 135–145)
Total Bilirubin: 0.9 mg/dL (ref 0.2–1.2)
Total Protein: 6.9 g/dL (ref 6.0–8.3)

## 2022-01-17 LAB — POCT GLYCOSYLATED HEMOGLOBIN (HGB A1C): Hemoglobin A1C: 9 % — AB (ref 4.0–5.6)

## 2022-01-17 LAB — LIPID PANEL
Cholesterol: 168 mg/dL (ref 0–200)
HDL: 34 mg/dL — ABNORMAL LOW (ref >39.00)
LDL Cholesterol: 115 mg/dL — ABNORMAL HIGH (ref 0–99)
NonHDL: 133.52
Total CHOL/HDL Ratio: 5
Triglycerides: 93 mg/dL (ref 0.0–149.0)
VLDL: 18.6 mg/dL (ref 0.0–40.0)

## 2022-01-17 MED ORDER — METFORMIN HCL 1000 MG PO TABS: 1000.0000 mg | ORAL_TABLET | Freq: Two times a day (BID) | ORAL | 3 refills | Status: DC

## 2022-01-17 NOTE — Progress Notes (Signed)
Lab Results  ?Component Value Date  ? HGBA1C 7.2 (A) 09/30/2021  ? ?Lab Results  ?Component Value Date  ? CHOL 118 02/03/2020  ? HDL 31 (L) 02/03/2020  ? LDLCALC 69 02/03/2020  ? TRIG 90 02/03/2020  ? CHOLHDL 3.8 02/03/2020  ? ?Oscar Fernandez ?46 y.o. ? ? ?Chief Complaint  ?Patient presents with  ? Follow-up  ? Headache  ?  X 3 days   ? Emesis  ?  Threw up twicee yesterday   ? ? ?HISTORY OF PRESENT ILLNESS: ?This is a 46 y.o. male with history of diabetes and dyslipidemia here for follow-up. ?Presently on Trulicity, metformin, and rosuvastatin. ?Just finished Ramadan.  Thinks he ate too much on Friday.  On Saturday threw up a couple times along with a headache.  Was nauseous yesterday but today he is back to normal.  No other associated symptoms. ?No other complaints or medical concerns today. ?States he was off Trulicity for about a month due to shortage of medication but he was able to refill it recently. ? ? ?Headache  ?Associated symptoms include vomiting. Pertinent negatives include no fever.  ?Emesis  ?Associated symptoms include headaches. Pertinent negatives include no chest pain, chills, diarrhea or fever.  ? ? ?Prior to Admission medications   ?Medication Sig Start Date End Date Taking? Authorizing Provider  ?aspirin EC 81 MG tablet Take 1 tablet (81 mg total) by mouth daily. 04/18/14  Yes Sherren Mocha, MD  ?Dulaglutide (TRULICITY) 3 MG/0.5ML SOPN Inject 3 mg as directed once a week. 03/22/21  Yes Detra Bores, Eilleen Kempf, MD  ?rosuvastatin (CRESTOR) 20 MG tablet Take 1 tablet (20 mg total) by mouth daily. 03/22/21  Yes Georgina Quint, MD  ?metFORMIN (GLUCOPHAGE) 1000 MG tablet Take 1 tablet (1,000 mg total) by mouth 2 (two) times daily with a meal. 03/22/21 09/30/21  Georgina Quint, MD  ? ? ?Not on File ? ?Patient Active Problem List  ? Diagnosis Date Noted  ? Dyslipidemia associated with type 2 diabetes mellitus ([facility]) 04/19/2014  ? Hyperlipidemia LDL goal <100 04/19/2014  ? ? ?Past Medical History:   ?Diagnosis Date  ? Diabetes mellitus without complication ([facility])   ? Hyperlipidemia   ? ? ?History reviewed. No pertinent surgical history. ? ?Social History  ? ?Socioeconomic History  ? Marital status: Married  ?  Spouse name: [place]  ? Number of children: 4  ? Years of education: college  ? Highest education level: Not on file  ?Occupational History  ? Occupation: industrial  ?Tobacco Use  ? Smoking status: Never  ? Smokeless tobacco: Never  ?Vaping Use  ? Vaping Use: Never used  ?Substance and Sexual Activity  ? Alcohol use: No  ? Drug use: No  ? Sexual activity: Yes  ?Other Topics Concern  ? Not on file  ?Social History Narrative  ? Not on file  ? ?Social Determinants of Health  ? ?Financial Resource Strain: Not on file  ?Food Insecurity: Not on file  ?Transportation Needs: Not on file  ?Physical Activity: Not on file  ?Stress: Not on file  ?Social Connections: Not on file  ?Intimate Partner Violence: Not on file  ? ? ?History reviewed. No pertinent family history. ? ? ?Review of Systems  ?Constitutional: Negative.  Negative for chills and fever.  ?HENT: Negative.    ?Eyes: Negative.   ?Cardiovascular: Negative.  Negative for chest pain.  ?Gastrointestinal:  Positive for vomiting. Negative for diarrhea.  ?Skin: Negative.  Negative for rash.  ?Neurological:  Positive for headaches.  ?All other systems reviewed and are negative. ? ?Today's Vitals  ? 01/17/22 0848  ?BP: 108/76  ?Pulse: 87  ?Temp: 97.7 ?F (36.5 ?C)  ?TempSrc: Oral  ?SpO2: 97%  ?Weight: 184 lb 4 oz (83.6 kg)  ?Height: 5\' 7"  (1.702 m)  ? ?Body mass index is 28.86 kg/m?. ?Wt Readings from Last 3 Encounters:  ?01/17/22 184 lb 4 oz (83.6 kg)  ?09/30/21 187 lb (84.8 kg)  ?06/22/21 191 lb (86.6 kg)  ? ? ?Physical Exam ?Vitals reviewed.  ?Constitutional:   ?   Appearance: He is well-developed.  ?HENT:  ?   Head: Normocephalic.  ?Eyes:  ?   Extraocular Movements: Extraocular movements intact.  ?   Pupils: Pupils are equal, round, and reactive to light.   ?Cardiovascular:  ?   Rate and Rhythm: Normal rate and regular rhythm.  ?   Pulses: Normal pulses.  ?   Heart sounds: Normal heart sounds.  ?Pulmonary:  ?   Effort: Pulmonary effort is normal.  ?   Breath sounds: Normal breath sounds.  ?Abdominal:  ?   Palpations: Abdomen is soft.  ?   Tenderness: There is no abdominal tenderness.  ?Musculoskeletal:  ?   Cervical back: Normal range of motion and neck supple. No tenderness.  ?Lymphadenopathy:  ?   Cervical: No cervical adenopathy.  ?Skin: ?   General: Skin is warm and dry.  ?   Capillary Refill: Capillary refill takes less than 2 seconds.  ?Neurological:  ?   General: No focal deficit present.  ?   Mental Status: He is alert and oriented to person, place, and time.  ?Psychiatric:     ?   Mood and Affect: Mood normal.     ?   Behavior: Behavior normal.  ? ?Results for orders placed or performed in visit on 01/17/22 (from the past 24 hour(s))  ?POCT glycosylated hemoglobin (Hb A1C)     Status: Abnormal  ? Collection Time: 01/17/22  8:57 AM  ?Result Value Ref Range  ? Hemoglobin A1C 9.0 (A) 4.0 - 5.6 %  ? HbA1c POC (<> result, manual entry)    ? HbA1c, POC (prediabetic range)    ? HbA1c, POC (controlled diabetic range)    ? ? ? ?ASSESSMENT & PLAN: ?A total of 50 minutes was spent with the patient and counseling/coordination of care regarding preparing for this visit, review of most recent office visit notes, review of most recent blood work results including today's hemoglobin A1c, education on nutrition and need to decrease amount of daily carbohydrate intake, cardiovascular risks associated with uncontrolled diabetes, prognosis, documentation and need for follow-up in 3 months. ? ?Problem List Items Addressed This Visit   ? ?  ? Endocrine  ? Dyslipidemia associated with type 2 diabetes mellitus ([facility]) - Primary  ?  Uncontrolled diabetes with hemoglobin A1c of 9.0. ?Patient has been off his Trulicity for about 2 months. ?Fasting also due to Ramadan. ?Back on his  medications metformin and Trulicity. ?Diet and nutrition discussed. ?Cardiovascular risks associated with uncontrolled diabetes discussed. ?Follow-up in 3 months. ?Lipid profile done today.  Continue rosuvastatin 20 mg daily. ? ? ?  ?  ? Relevant Medications  ? metFORMIN (GLUCOPHAGE) 1000 MG tablet  ? Other Relevant Orders  ? Urine Microalbumin w/creat. ratio  ? POCT glycosylated hemoglobin (Hb A1C) (Completed)  ? Lipid panel  ? Comprehensive metabolic panel  ? ?Patient Instructions  ?Diabetes Mellitus and Nutrition, Adult ?When you have diabetes, or  diabetes mellitus, it is very important to have healthy eating habits because your blood sugar (glucose) levels are greatly affected by what you eat and drink. Eating healthy foods in the right amounts, at about the same times every day, can help you: ?Manage your blood glucose. ?Lower your risk of heart disease. ?Improve your blood pressure. ?Reach or maintain a healthy weight. ?What can affect my meal plan? ?Every person with diabetes is different, and each person has different needs for a meal plan. Your health care provider may recommend that you work with a dietitian to make a meal plan that is best for you. Your meal plan may vary depending on factors such as: ?The calories you need. ?The medicines you take. ?Your weight. ?Your blood glucose, blood pressure, and cholesterol levels. ?Your activity level. ?Other health conditions you have, such as heart or kidney disease. ?How do carbohydrates affect me? ?Carbohydrates, also called carbs, affect your blood glucose level more than any other type of food. Eating carbs raises the amount of glucose in your blood. ?It is important to know how many carbs you can safely have in each meal. This is different for every person. Your dietitian can help you calculate how many carbs you should have at each meal and for each snack. ?How does alcohol affect me? ?Alcohol can cause a decrease in blood glucose (hypoglycemia),  especially if you use insulin or take certain diabetes medicines by mouth. Hypoglycemia can be a life-threatening condition. Symptoms of hypoglycemia, such as sleepiness, dizziness, and confusion, are similar to sym

## 2022-01-17 NOTE — Patient Instructions (Signed)

## 2022-01-17 NOTE — Assessment & Plan Note (Signed)
Uncontrolled diabetes with hemoglobin A1c of 9.0. ?Patient has been off his Trulicity for about 2 months. ?Fasting also due to Ramadan. ?Back on his medications metformin and Trulicity. ?Diet and nutrition discussed. ?Cardiovascular risks associated with uncontrolled diabetes discussed. ?Follow-up in 3 months. ?Lipid profile done today.  Continue rosuvastatin 20 mg daily. ? ?

## 2022-03-10 DIAGNOSIS — Z23 Encounter for immunization: Secondary | ICD-10-CM | POA: Diagnosis not present

## 2022-05-03 ENCOUNTER — Ambulatory Visit: Payer: BC Managed Care – PPO | Admitting: Emergency Medicine

## 2022-05-12 DIAGNOSIS — H40013 Open angle with borderline findings, low risk, bilateral: Secondary | ICD-10-CM | POA: Diagnosis not present

## 2022-05-12 DIAGNOSIS — H04213 Epiphora due to excess lacrimation, bilateral lacrimal glands: Secondary | ICD-10-CM | POA: Diagnosis not present

## 2022-05-12 DIAGNOSIS — H25013 Cortical age-related cataract, bilateral: Secondary | ICD-10-CM | POA: Diagnosis not present

## 2022-05-12 DIAGNOSIS — E119 Type 2 diabetes mellitus without complications: Secondary | ICD-10-CM | POA: Diagnosis not present

## 2022-05-12 DIAGNOSIS — H524 Presbyopia: Secondary | ICD-10-CM | POA: Diagnosis not present

## 2022-05-26 DIAGNOSIS — H04553 Acquired stenosis of bilateral nasolacrimal duct: Secondary | ICD-10-CM | POA: Diagnosis not present

## 2022-05-26 DIAGNOSIS — H04223 Epiphora due to insufficient drainage, bilateral lacrimal glands: Secondary | ICD-10-CM | POA: Diagnosis not present

## 2022-05-26 DIAGNOSIS — J343 Hypertrophy of nasal turbinates: Secondary | ICD-10-CM | POA: Diagnosis not present

## 2022-05-26 DIAGNOSIS — H0279 Other degenerative disorders of eyelid and periocular area: Secondary | ICD-10-CM | POA: Diagnosis not present

## 2022-05-26 DIAGNOSIS — H25813 Combined forms of age-related cataract, bilateral: Secondary | ICD-10-CM | POA: Diagnosis not present

## 2022-06-10 ENCOUNTER — Ambulatory Visit (HOSPITAL_COMMUNITY)
Admission: EM | Admit: 2022-06-10 | Discharge: 2022-06-10 | Disposition: A | Discharge status: Home / Self Care | Payer: BC Managed Care – PPO | Attending: Internal Medicine | Admitting: Internal Medicine

## 2022-06-10 ENCOUNTER — Emergency Department (HOSPITAL_BASED_OUTPATIENT_CLINIC_OR_DEPARTMENT_OTHER)
Admission: EM | Admit: 2022-06-10 | Discharge: 2022-06-10 | Disposition: A | Discharge status: Home / Self Care | Payer: BC Managed Care – PPO | Attending: Emergency Medicine | Admitting: Emergency Medicine

## 2022-06-10 ENCOUNTER — Encounter (HOSPITAL_COMMUNITY): Payer: Self-pay

## 2022-06-10 ENCOUNTER — Encounter (HOSPITAL_BASED_OUTPATIENT_CLINIC_OR_DEPARTMENT_OTHER): Payer: Self-pay | Admitting: Emergency Medicine

## 2022-06-10 ENCOUNTER — Other Ambulatory Visit: Payer: Self-pay

## 2022-06-10 DIAGNOSIS — R739 Hyperglycemia, unspecified: Secondary | ICD-10-CM

## 2022-06-10 DIAGNOSIS — Z7982 Long term (current) use of aspirin: Secondary | ICD-10-CM | POA: Diagnosis not present

## 2022-06-10 DIAGNOSIS — E1165 Type 2 diabetes mellitus with hyperglycemia: Secondary | ICD-10-CM | POA: Diagnosis not present

## 2022-06-10 DIAGNOSIS — H5711 Ocular pain, right eye: Secondary | ICD-10-CM

## 2022-06-10 DIAGNOSIS — Z7984 Long term (current) use of oral hypoglycemic drugs: Secondary | ICD-10-CM

## 2022-06-10 DIAGNOSIS — R519 Headache, unspecified: Secondary | ICD-10-CM | POA: Diagnosis not present

## 2022-06-10 LAB — CBG MONITORING, ED
Glucose-Capillary: 367 mg/dL — ABNORMAL HIGH (ref 70–99)
Glucose-Capillary: 312 mg/dL — ABNORMAL HIGH (ref 70–99)
Glucose-Capillary: 159 mg/dL — ABNORMAL HIGH (ref 70–99)

## 2022-06-10 LAB — CBC WITH DIFFERENTIAL/PLATELET
Abs Immature Granulocytes: 0.01 10*3/uL (ref 0.00–0.07)
Basophils Absolute: 0 10*3/uL (ref 0.0–0.1)
Basophils Relative: 1 %
Eosinophils Absolute: 0.1 10*3/uL (ref 0.0–0.5)
Eosinophils Relative: 3 %
HCT: 43.9 % (ref 39.0–52.0)
Hemoglobin: 14.7 g/dL (ref 13.0–17.0)
Immature Granulocytes: 0 %
Lymphocytes Relative: 49 %
Lymphs Abs: 2.3 10*3/uL (ref 0.7–4.0)
MCH: 28.3 pg (ref 26.0–34.0)
MCHC: 33.5 g/dL (ref 30.0–36.0)
MCV: 84.4 fL (ref 80.0–100.0)
Monocytes Absolute: 0.4 10*3/uL (ref 0.1–1.0)
Monocytes Relative: 9 %
Neutro Abs: 1.7 10*3/uL (ref 1.7–7.7)
Neutrophils Relative %: 38 %
Platelets: 209 10*3/uL (ref 150–400)
RBC: 5.2 MIL/uL (ref 4.22–5.81)
RDW: 12.9 % (ref 11.5–15.5)
WBC: 4.6 10*3/uL (ref 4.0–10.5)
nRBC: 0 % (ref 0.0–0.2)

## 2022-06-10 LAB — COMPREHENSIVE METABOLIC PANEL
ALT: 32 U/L (ref 0–44)
AST: 20 U/L (ref 15–41)
Albumin: 4.3 g/dL (ref 3.5–5.0)
Alkaline Phosphatase: 62 U/L (ref 38–126)
Anion gap: 10 (ref 5–15)
BUN: 16 mg/dL (ref 6–20)
CO2: 28 mmol/L (ref 22–32)
Calcium: 9.2 mg/dL (ref 8.9–10.3)
Chloride: 99 mmol/L (ref 98–111)
Creatinine, Ser: 0.67 mg/dL (ref 0.61–1.24)
GFR, Estimated: >60 mL/min (ref >60)
Glucose, Bld: 197 mg/dL — ABNORMAL HIGH (ref 70–99)
Potassium: 4.1 mmol/L (ref 3.5–5.1)
Sodium: 137 mmol/L (ref 135–145)
Total Bilirubin: 0.5 mg/dL (ref 0.3–1.2)
Total Protein: 6.7 g/dL (ref 6.5–8.1)

## 2022-06-10 MED ORDER — IBUPROFEN 800 MG PO TABS
ORAL_TABLET | ORAL | Status: AC
  Filled 2022-06-10: qty 1

## 2022-06-10 MED ORDER — IBUPROFEN 800 MG PO TABS
800.0000 mg | ORAL_TABLET | Freq: Once | ORAL | Status: AC
  Administered 2022-06-10: 800 mg via ORAL

## 2022-06-10 NOTE — ED Notes (Signed)
Pt requesting CBG check due to high blood sugar and labs to be drawn. Pt without further complaint.

## 2022-06-10 NOTE — ED Provider Notes (Incomplete)
[facility] EMERGENCY DEPT Provider Note   CSN: [id number] Arrival date & time: 06/10/22  1937     History {Add pertinent medical, surgical, social history, OB history to HPI:1} Chief Complaint  Patient presents with  . Hyperglycemia    Oscar Fernandez is a 46 y.o. male.  HPI      Right eye pain 5AM Still having headache Went to urgent care Found blood sugar high and sent here Woke up with pain like something got into the eye, finally did the eye wash, and the pain improved and now not having any pain  Had a headache and received ibuprofen and improved Headache began slowly, was not much in AM, around noon was worse No numbness, weakness, no difficulty walking talking or facial droop No fever, no blurred vision, no double vision  Metformin for years, dr added trulicity, added it but have not had for the last 2 months, traveled, went to [place] so didn't take it then  No increased thirst, not urinating more Usually will notice sugar high when urinating more Didn't have that this time   Home Medications Prior to Admission medications   Medication Sig Start Date End Date Taking? Authorizing Provider  aspirin EC 81 MG tablet Take 1 tablet (81 mg total) by mouth daily. 04/18/14   Sherren Mocha, MD  Dulaglutide (TRULICITY) 3 MG/0.5ML SOPN Inject 3 mg as directed once a week. 03/22/21   Georgina Quint, MD  metFORMIN (GLUCOPHAGE) 1000 MG tablet Take 1 tablet (1,000 mg total) by mouth 2 (two) times daily with a meal. 01/17/22 01/12/23  Sagardia, Eilleen Kempf, MD  rosuvastatin (CRESTOR) 20 MG tablet Take 1 tablet (20 mg total) by mouth daily. 03/22/21   Georgina Quint, MD      Allergies    Patient has no allergy information on record.    Review of Systems   Review of Systems  Physical Exam Updated Vital Signs BP (!) 157/88   Pulse 74   Temp (!) 97 F (36.1 C)   Resp 18   Ht 5\' 7"  (1.702 m)   Wt 83.9 kg   SpO2 95%   BMI 28.98 kg/m  Physical  Exam  ED Results / Procedures / Treatments   Labs (all labs ordered are listed, but only abnormal results are displayed) Labs Reviewed  COMPREHENSIVE METABOLIC PANEL - Abnormal; Notable for the following components:      Result Value   Glucose, Bld 197 (*)    All other components within normal limits  CBG MONITORING, ED - Abnormal; Notable for the following components:   Glucose-Capillary 312 (*)    All other components within normal limits  CBC WITH DIFFERENTIAL/PLATELET    EKG None  Radiology No results found.  Procedures Procedures  {Document cardiac monitor, telemetry assessment procedure when appropriate:1}  Medications Ordered in ED Medications - No data to display  ED Course/ Medical Decision Making/ A&P                           Medical Decision Making  ***  {Document critical care time when appropriate:1} {Document review of labs and clinical decision tools ie heart score, Chads2Vasc2 etc:1}  {Document your independent review of radiology images, and any outside records:1} {Document your discussion with family members, caretakers, and with consultants:1} {Document social determinants of health affecting pt's care:1} {Document your decision making why or why not admission, treatments were needed:1} Final Clinical Impression(s) / ED Diagnoses  Final diagnoses:  None    Rx / DC Orders ED Discharge Orders     None

## 2022-06-10 NOTE — ED Triage Notes (Addendum)
Pt was seen at [facility] for headache and sent here for hyperglycemia and blood work. Pt has hx of DM and takes metformin which he states he has been compliant. CBG 312 in triage

## 2022-06-10 NOTE — ED Triage Notes (Signed)
Pt states right eye pain when he woke up this morning, he states he could not open his eye.

## 2022-06-10 NOTE — Discharge Instructions (Addendum)
I would like for you to go to the nearest emergency department for further evaluation and likely urgent blood work due to your elevated blood sugar in the clinic today in the setting of headache and eye pain.

## 2022-06-10 NOTE — ED Notes (Signed)
Patient is being discharged from the Urgent Care and sent to the Emergency Department via private vehicle . Per C.Stanhope FNP, patient is in need of higher level of care due to hyperglycemia. Patient is aware and verbalizes understanding of plan of care.  Vitals:   06/10/22 1743  BP: (!) 155/98  Pulse: 89  Resp: 16  Temp: 98.4 F (36.9 C)  SpO2: 97%

## 2022-06-10 NOTE — ED Provider Notes (Signed)
[facility]    CSN: [id number] Arrival date & time: 06/10/22  1714      History   Chief Complaint Chief Complaint  Patient presents with   Eye Pain    HPI Oscar Fernandez is a 46 y.o. male.   Patient presents urgent care for evaluation of headache that started approximately 5 AM this morning with associated right eye pain.  States when he woke up he had difficulty opening his right eye due to pain.  Pain has since improved to head and eye.  Denies mucus drainage, redness, itching, and blurry vision to the eyes bilaterally.  States he has had headaches in the past frequently but none of the headaches have ever caused unilateral eye pain. In the past, headaches have responded well to ibuprofen/aspirin. Headache is generalized to the entire head and currently a 5 on a scale of 0-10.  Right-sided eye pain is currently a 2 on a scale of 0-10.  Patient denies blurry vision and decreased visual acuity at this time.  No photophobia currently.  States symptoms have improved significantly since this started but headache and eye pain have persisted.  He recently went to the optometrist where he was found to have cataracts and needed to increase his glasses prescription but otherwise had a normal exam.  Also follows with PCP for diabetes mellitus and takes metformin regularly.  Patient checked his sugar at home 2 days ago and states it was 140.  No URI symptoms, recent trauma to the head, nausea, vomiting, abdominal pain, and dizziness.  He has not attempted any medications over-the-counter prior to arrival urgent care for symptoms today.   Eye Pain    Past Medical History:  Diagnosis Date   Diabetes mellitus without complication (HCC)    Hyperlipidemia     Patient Active Problem List   Diagnosis Date Noted   Dyslipidemia associated with type 2 diabetes mellitus (HCC) 04/19/2014   Hyperlipidemia LDL goal <100 04/19/2014    History reviewed. No pertinent surgical  history.     Home Medications    Prior to Admission medications   Medication Sig Start Date End Date Taking? Authorizing Provider  aspirin EC 81 MG tablet Take 1 tablet (81 mg total) by mouth daily. 04/18/14   Sherren Mocha, MD  Dulaglutide (TRULICITY) 3 MG/0.5ML SOPN Inject 3 mg as directed once a week. 03/22/21   Georgina Quint, MD  metFORMIN (GLUCOPHAGE) 1000 MG tablet Take 1 tablet (1,000 mg total) by mouth 2 (two) times daily with a meal. 01/17/22 01/12/23  Sagardia, Eilleen Kempf, MD  rosuvastatin (CRESTOR) 20 MG tablet Take 1 tablet (20 mg total) by mouth daily. 03/22/21   Georgina Quint, MD    Family History History reviewed. No pertinent family history.  Social History Social History   Tobacco Use   Smoking status: Never   Smokeless tobacco: Never  Vaping Use   Vaping Use: Never used  Substance Use Topics   Alcohol use: No   Drug use: No     Allergies   Patient has no allergy information on record.   Review of Systems Review of Systems  Eyes:  Positive for pain.  Per HPI   Physical Exam Triage Vital Signs ED Triage Vitals  Enc Vitals Group     BP 06/10/22 1743 (!) 155/98     Pulse Rate 06/10/22 1743 89     Resp 06/10/22 1743 16     Temp 06/10/22 1743 98.4 F (36.9 C)  Temp Source 06/10/22 1743 Oral     SpO2 06/10/22 1743 97 %     Weight --      Height --      Head Circumference --      Peak Flow --      Pain Score 06/10/22 1744 2     Pain Loc --      Pain Edu? --      Excl. in GC? --    No data found.  Updated Vital Signs BP (!) 155/98 (BP Location: Right Arm)   Pulse 89   Temp 98.4 F (36.9 C) (Oral)   Resp 16   SpO2 97%   Visual Acuity Right Eye Distance:   Left Eye Distance:   Bilateral Distance:    Right Eye Near:   Left Eye Near:    Bilateral Near:     Physical Exam Vitals and nursing note reviewed.  Constitutional:      Appearance: Normal appearance. He is not ill-appearing or toxic-appearing.     Comments:  Very pleasant patient sitting on exam in position of comfort table in no acute distress.   HENT:     Head: Normocephalic and atraumatic.     Right Ear: Hearing, tympanic membrane, ear canal and external ear normal.     Left Ear: Hearing, tympanic membrane, ear canal and external ear normal.     Nose: Nose normal.     Mouth/Throat:     Lips: Pink.     Mouth: Mucous membranes are moist.  Eyes:     General: Lids are normal. Vision grossly intact. Gaze aligned appropriately. No visual field deficit.    Extraocular Movements: Extraocular movements intact.     Conjunctiva/sclera: Conjunctivae normal.  Pulmonary:     Effort: Pulmonary effort is normal.  Abdominal:     Palpations: Abdomen is soft.  Musculoskeletal:     Cervical back: Neck supple.  Lymphadenopathy:     Cervical: No cervical adenopathy.  Skin:    General: Skin is warm and dry.     Capillary Refill: Capillary refill takes less than 2 seconds.     Findings: No rash.  Neurological:     General: No focal deficit present.     Mental Status: He is alert and oriented to person, place, and time. Mental status is at baseline.     Cranial Nerves: Cranial nerves 2-12 are intact. No dysarthria or facial asymmetry.     Sensory: Sensation is intact.     Motor: Motor function is intact. No weakness.     Coordination: Coordination is intact. Romberg sign negative. Coordination normal. Finger-Nose-Finger Test normal.     Gait: Gait is intact.     Comments: Neurologically intact at baseline.  5/5 strength throughout.  No focal neurologic deficit.  Psychiatric:        Mood and Affect: Mood normal.        Speech: Speech normal.        Behavior: Behavior normal.        Thought Content: Thought content normal.        Judgment: Judgment normal.      UC Treatments / Results  Labs (all labs ordered are listed, but only abnormal results are displayed) Labs Reviewed  CBG MONITORING, ED - Abnormal; Notable for the following components:       Result Value   Glucose-Capillary 367 (*)    All other components within normal limits    EKG   Radiology No results  found.  Procedures Procedures (including critical care time)  Medications Ordered in UC Medications  ibuprofen (ADVIL) tablet 800 mg (800 mg Oral Given 06/10/22 1851)    Initial Impression / Assessment and Plan / UC Course  I have reviewed the triage vital signs and the nursing notes.  Pertinent labs & imaging results that were available during my care of the patient were reviewed by me and considered in my medical decision making (see chart for details).   1.  Bad headache and elevated blood sugar Blood sugars elevated from baseline at 367.  Most recent hemoglobin A1c from 4 months ago in April 2023 is 9.0.  Patient has been taking metformin as prescribed and most recently ate lunch at 1 PM.  Due to elevated blood sugar and headache/eye pain patient would benefit from urgent blood work and work-up in the emergency department.  Discussed the exam findings and available lab work findings with patient as well as recommendations to go to the emergency department.  Patient verbalized understanding agreement with plan.  He is stable to go by personal vehicle at this time due to hemodynamically stable vital signs.  Discussed risks of deferring emergency department visit at this time to which she verbalized understanding agreement.  Patient discharged from urgent care in stable condition with plan to go to the nearest emergency department due to possible dehydration and elevated blood sugar level in setting of headache and eye pain. Final Clinical Impressions(s) / UC Diagnoses   Final diagnoses:  Bad headache  Pain of right eye  Elevated blood sugar     Discharge Instructions      I would like for you to go to the nearest emergency department for further evaluation and likely urgent blood work due to your elevated blood sugar in the clinic today in the setting of  headache and eye pain.      ED Prescriptions   None    PDMP not reviewed this encounter.   Carlisle Beers, [place] 06/10/22 1910

## 2022-06-13 ENCOUNTER — Other Ambulatory Visit: Payer: Self-pay | Admitting: Emergency Medicine

## 2022-06-13 DIAGNOSIS — E1169 Type 2 diabetes mellitus with other specified complication: Secondary | ICD-10-CM

## 2022-07-02 ENCOUNTER — Other Ambulatory Visit: Payer: Self-pay | Admitting: Emergency Medicine

## 2022-07-02 DIAGNOSIS — E1169 Type 2 diabetes mellitus with other specified complication: Secondary | ICD-10-CM

## 2022-07-05 ENCOUNTER — Other Ambulatory Visit: Payer: Self-pay | Admitting: Emergency Medicine

## 2022-07-05 DIAGNOSIS — E1169 Type 2 diabetes mellitus with other specified complication: Secondary | ICD-10-CM

## 2022-09-06 ENCOUNTER — Other Ambulatory Visit: Payer: Self-pay | Admitting: Emergency Medicine

## 2022-09-06 DIAGNOSIS — E1169 Type 2 diabetes mellitus with other specified complication: Secondary | ICD-10-CM

## 2022-10-26 ENCOUNTER — Other Ambulatory Visit: Payer: Self-pay | Admitting: Emergency Medicine

## 2022-10-26 DIAGNOSIS — E1169 Type 2 diabetes mellitus with other specified complication: Secondary | ICD-10-CM

## 2023-02-01 DIAGNOSIS — E119 Type 2 diabetes mellitus without complications: Secondary | ICD-10-CM | POA: Diagnosis not present

## 2023-02-01 DIAGNOSIS — H40013 Open angle with borderline findings, low risk, bilateral: Secondary | ICD-10-CM | POA: Diagnosis not present

## 2023-02-01 DIAGNOSIS — H25013 Cortical age-related cataract, bilateral: Secondary | ICD-10-CM | POA: Diagnosis not present

## 2023-02-01 DIAGNOSIS — H04213 Epiphora due to excess lacrimation, bilateral lacrimal glands: Secondary | ICD-10-CM | POA: Diagnosis not present

## 2023-02-01 LAB — HM DIABETES EYE EXAM

## 2023-03-02 ENCOUNTER — Other Ambulatory Visit: Payer: Self-pay | Admitting: Emergency Medicine

## 2023-03-02 DIAGNOSIS — E1169 Type 2 diabetes mellitus with other specified complication: Secondary | ICD-10-CM

## 2023-05-04 DIAGNOSIS — H524 Presbyopia: Secondary | ICD-10-CM | POA: Diagnosis not present

## 2023-05-04 DIAGNOSIS — H40013 Open angle with borderline findings, low risk, bilateral: Secondary | ICD-10-CM | POA: Diagnosis not present

## 2023-05-17 ENCOUNTER — Other Ambulatory Visit: Payer: Self-pay | Admitting: Emergency Medicine

## 2023-05-17 DIAGNOSIS — E1169 Type 2 diabetes mellitus with other specified complication: Secondary | ICD-10-CM

## 2023-07-27 ENCOUNTER — Encounter (HOSPITAL_BASED_OUTPATIENT_CLINIC_OR_DEPARTMENT_OTHER): Payer: Self-pay

## 2023-07-27 ENCOUNTER — Emergency Department (HOSPITAL_BASED_OUTPATIENT_CLINIC_OR_DEPARTMENT_OTHER): Payer: BC Managed Care – PPO | Admitting: Radiology

## 2023-07-27 ENCOUNTER — Emergency Department (HOSPITAL_BASED_OUTPATIENT_CLINIC_OR_DEPARTMENT_OTHER)
Admission: EM | Admit: 2023-07-27 | Discharge: 2023-07-27 | Disposition: A | Discharge status: Home / Self Care | Payer: BC Managed Care – PPO | Attending: Emergency Medicine | Admitting: Emergency Medicine

## 2023-07-27 ENCOUNTER — Other Ambulatory Visit: Payer: Self-pay

## 2023-07-27 DIAGNOSIS — R079 Chest pain, unspecified: Secondary | ICD-10-CM | POA: Insufficient documentation

## 2023-07-27 DIAGNOSIS — E1165 Type 2 diabetes mellitus with hyperglycemia: Secondary | ICD-10-CM | POA: Diagnosis not present

## 2023-07-27 DIAGNOSIS — Z7982 Long term (current) use of aspirin: Secondary | ICD-10-CM | POA: Diagnosis not present

## 2023-07-27 DIAGNOSIS — R739 Hyperglycemia, unspecified: Secondary | ICD-10-CM

## 2023-07-27 LAB — CBC
HCT: 42.7 % (ref 39.0–52.0)
Hemoglobin: 14.4 g/dL (ref 13.0–17.0)
MCH: 29.1 pg (ref 26.0–34.0)
MCHC: 33.7 g/dL (ref 30.0–36.0)
MCV: 86.3 fL (ref 80.0–100.0)
Platelets: 206 10*3/uL (ref 150–400)
RBC: 4.95 MIL/uL (ref 4.22–5.81)
RDW: 13.3 % (ref 11.5–15.5)
WBC: 4.5 10*3/uL (ref 4.0–10.5)
nRBC: 0 % (ref 0.0–0.2)

## 2023-07-27 LAB — BASIC METABOLIC PANEL
Anion gap: 6 (ref 5–15)
BUN: 17 mg/dL (ref 6–20)
CO2: 28 mmol/L (ref 22–32)
Calcium: 9.4 mg/dL (ref 8.9–10.3)
Chloride: 103 mmol/L (ref 98–111)
Creatinine, Ser: 0.83 mg/dL (ref 0.61–1.24)
GFR, Estimated: >60 mL/min (ref >60)
Glucose, Bld: 370 mg/dL — ABNORMAL HIGH (ref 70–99)
Potassium: 4 mmol/L (ref 3.5–5.1)
Sodium: 137 mmol/L (ref 135–145)

## 2023-07-27 LAB — TROPONIN I (HIGH SENSITIVITY): Troponin I (High Sensitivity): 3 ng/L (ref <18)

## 2023-07-27 LAB — CBG MONITORING, ED: Glucose-Capillary: 245 mg/dL — ABNORMAL HIGH (ref 70–99)

## 2023-07-27 MED ORDER — INSULIN REGULAR HUMAN 100 UNIT/ML IJ SOLN: 6.0000 [IU] | Freq: Once | INTRAMUSCULAR | Status: DC

## 2023-07-27 MED ORDER — INSULIN ASPART 100 UNIT/ML IJ SOLN
6.0000 [IU] | Freq: Once | INTRAMUSCULAR | Status: AC
  Administered 2023-07-27: 6 [IU] via SUBCUTANEOUS

## 2023-07-27 MED ORDER — SODIUM CHLORIDE 0.9 % IV BOLUS
1000.0000 mL | Freq: Once | INTRAVENOUS | Status: AC
  Administered 2023-07-27: 1000 mL via INTRAVENOUS

## 2023-07-27 NOTE — ED Provider Notes (Signed)
[place] EMERGENCY DEPARTMENT AT [facility] Provider Note   CSN: [id number] Arrival date & time: 07/27/23  1828     History  Chief Complaint  Patient presents with   Chest Pain    Oscar Fernandez is a 47 y.o. male with past medical history significant for diabetes, hyperlipidemia who presents concern for left upper intermittent stabbing chest pain since Monday.  Patient reports that episodes are severe, around 8/10, last for around 2 minutes at a time and then resolved spontaneously.  He reports that he had 3 episodes today which caused increased worry and he wanted to seek evaluation.  No previous ACS, stroke, hypertension, no strong family history of ACS that he knows of.  He is chest pain-free at this time, reports last episode of stabbing chest pain was around 330 today.  He denies any abdominal pain, nausea, vomiting, radiation of his pain.  Denies any tobacco abuse or alcohol abuse.   Chest Pain      Home Medications Prior to Admission medications   Medication Sig Start Date End Date Taking? Authorizing Provider  aspirin EC 81 MG tablet Take 1 tablet (81 mg total) by mouth daily. 04/18/14   Sherren Mocha, MD  metFORMIN (GLUCOPHAGE) 1000 MG tablet TAKE 1 TABLET BY MOUTH TWICE DAILY WITH A MEAL 05/17/23   Georgina Quint, MD  rosuvastatin (CRESTOR) 20 MG tablet TAKE 1 TABLET BY MOUTH ONCE DAILY. PATINET NEEDS AN OV FOR ADDITIONAL REFILLS. 07/05/22   Georgina Quint, MD  TRULICITY 3 MG/0.5ML SOPN INJECT 3 MG SUBCUTANEOUSLY ONCE A WEEK . APPOINTMENT REQUIRED FOR FUTURE REFILLS 05/17/23   Georgina Quint, MD      Allergies    Patient has no known allergies.    Review of Systems   Review of Systems  Cardiovascular:  Positive for chest pain.  All other systems reviewed and are negative.   Physical Exam Updated Vital Signs BP (!) 147/97   Pulse 93   Temp 98 F (36.7 C)   Resp 13   SpO2 99%  Physical Exam Vitals and nursing note reviewed.   Constitutional:      General: He is not in acute distress.    Appearance: Normal appearance.  HENT:     Head: Normocephalic and atraumatic.  Eyes:     General:        Right eye: No discharge.        Left eye: No discharge.  Cardiovascular:     Rate and Rhythm: Normal rate and regular rhythm.     Heart sounds: No murmur heard.    No friction rub. No gallop.  Pulmonary:     Effort: Pulmonary effort is normal. No respiratory distress.     Breath sounds: Normal breath sounds.  Abdominal:     General: Bowel sounds are normal.     Palpations: Abdomen is soft.  Musculoskeletal:        General: No deformity.  Skin:    General: Skin is warm and dry.     Capillary Refill: Capillary refill takes less than 2 seconds.  Neurological:     Mental Status: He is alert and oriented to person, place, and time.  Psychiatric:        Mood and Affect: Mood normal.        Behavior: Behavior normal.     ED Results / Procedures / Treatments   Labs (all labs ordered are listed, but only abnormal results are displayed) Labs Reviewed  BASIC  METABOLIC PANEL - Abnormal; Notable for the following components:      Result Value   Glucose, Bld 370 (*)    All other components within normal limits  CBG MONITORING, ED - Abnormal; Notable for the following components:   Glucose-Capillary 245 (*)    All other components within normal limits  CBC  TROPONIN I (HIGH SENSITIVITY)    EKG None  Radiology No results found.  Procedures Procedures    Medications Ordered in ED Medications  sodium chloride 0.9 % bolus 1,000 mL (0 mLs Intravenous Stopped 07/27/23 2016)  insulin aspart (novoLOG) injection 6 Units (6 Units Subcutaneous Given 07/27/23 1946)    ED Course/ Medical Decision Making/ A&P                                 Medical Decision Making Amount and/or Complexity of Data Reviewed Labs: ordered. Radiology: ordered.   This patient is a 47 y.o. male  who presents to the ED for concern  of chest pain.   Differential diagnoses prior to evaluation: The emergent differential diagnosis includes, but is not limited to,  ACS, AAS, PE, Mallory-Weiss, Boerhaave's, Pneumonia, acute bronchitis, asthma or COPD exacerbation, anxiety, MSK pain or traumatic injury to the chest, acid reflux versus other . This is not an exhaustive differential.   Past Medical History / Co-morbidities / Social History: Diabetes, HLD  Additional history: Chart reviewed. Pertinent results include: Reviewed lab work, imaging from previous emergency department visits, no previous cardiac procedures, no echo or stress test  Physical Exam: Physical exam performed. The pertinent findings include: Patient somewhat hypertensive in the emergency department, blood pressure 147/98, otherwise vital signs unremarkable.  His physical exam is reassuring, no accessory breath sounds, no tenderness to palpation of chest wall, he rates 0 out of 10 pain in the emergency department.  Lab Tests/Imaging studies: I personally interpreted labs/imaging and the pertinent results include: BMP notable for glucose elevated at 370, his initial troponin is 3, his last episode of chest pain was at 3:30. I independently interpreted clinical chest x-ray which shows no evidence of acute intrathoracic abnormality.I agree with the radiologist interpretation.  Cardiac monitoring: EKG obtained and interpreted by myself and attending physician which shows: Normal sinus rhythm, inferior T wave inversions noted, no significant change from last baseline EKG.   Medications: I ordered medication including fluid bolus, insulin for hyperglycemia, on reevaluation glucose improved to 245.  I have reviewed the patients home medicines and have made adjustments as needed.   Disposition: After consideration of the diagnostic results and the patients response to treatment, I feel that patient with atypical presentation of chest pain, no evidence of acute cardiac  disease in the department.  Chest pain remains resolved, patient stable for discharge at this time.  He declines cardiology follow-up at this time which I think is reasonable as he has atypical chest pain, he will follow-up with his PCP.  emergency department workup does not suggest an emergent condition requiring admission or immediate intervention beyond what has been performed at this time. The plan is: as above. The patient is safe for discharge and has been instructed to return immediately for worsening symptoms, change in symptoms or any other concerns.  Final Clinical Impression(s) / ED Diagnoses Final diagnoses:  None    Rx / DC Orders ED Discharge Orders     None         Unita Detamore H,  PA-C 07/27/23 2029    Elayne Snare K, DO 07/27/23 2311

## 2023-07-27 NOTE — Discharge Instructions (Signed)
If you have worsening or persistent chest pain, shortness of breath I recommend that you return to the emergency department for further evaluation management.  Make sure to take your home diabetes medications, and if you continue having poorly controlled blood sugar with your home meds you may want to discuss with your primary care doctor increasing your home medications.

## 2023-07-27 NOTE — ED Notes (Signed)
Pt to XR at this time

## 2023-07-27 NOTE — ED Notes (Signed)
Reviewed AVS with patient, patient expressed understanding of directions, denies further questions at this time. 

## 2023-07-27 NOTE — ED Triage Notes (Signed)
He c/o lateral, upper left sided intermittent "stabbing" chest pain since this Mon. These episodes do not last for more than two minutes, and occur randomly. His skin is normal, warm and dry, and he is breathing normally.

## 2023-08-14 ENCOUNTER — Other Ambulatory Visit: Payer: Self-pay | Admitting: Emergency Medicine

## 2023-08-14 DIAGNOSIS — E785 Hyperlipidemia, unspecified: Secondary | ICD-10-CM

## 2023-11-05 ENCOUNTER — Other Ambulatory Visit: Payer: Self-pay | Admitting: Emergency Medicine

## 2023-11-05 DIAGNOSIS — E1169 Type 2 diabetes mellitus with other specified complication: Secondary | ICD-10-CM

## 2023-11-13 ENCOUNTER — Other Ambulatory Visit: Payer: Self-pay | Admitting: Emergency Medicine

## 2023-11-13 DIAGNOSIS — E1169 Type 2 diabetes mellitus with other specified complication: Secondary | ICD-10-CM

## 2024-02-01 LAB — HM DIABETES EYE EXAM

## 2024-02-10 ENCOUNTER — Other Ambulatory Visit: Payer: Self-pay | Admitting: Emergency Medicine

## 2024-02-10 DIAGNOSIS — E1169 Type 2 diabetes mellitus with other specified complication: Secondary | ICD-10-CM

## 2024-05-02 ENCOUNTER — Other Ambulatory Visit: Payer: Self-pay | Admitting: Emergency Medicine

## 2024-05-02 DIAGNOSIS — E1169 Type 2 diabetes mellitus with other specified complication: Secondary | ICD-10-CM

## 2024-07-30 ENCOUNTER — Other Ambulatory Visit: Payer: Self-pay | Admitting: Emergency Medicine

## 2024-07-30 DIAGNOSIS — E1169 Type 2 diabetes mellitus with other specified complication: Secondary | ICD-10-CM

## 2024-07-30 NOTE — Telephone Encounter (Signed)
 Patient has been scheduled

## 2024-07-31 ENCOUNTER — Encounter: Payer: Self-pay | Admitting: Emergency Medicine

## 2024-07-31 ENCOUNTER — Ambulatory Visit (INDEPENDENT_AMBULATORY_CARE_PROVIDER_SITE_OTHER): Admitting: Emergency Medicine

## 2024-07-31 VITALS — BP 124/84 | HR 79 | Temp 98.1°F | Ht 67.0 in | Wt 196.0 lb

## 2024-07-31 DIAGNOSIS — Z0000 Encounter for general adult medical examination without abnormal findings: Secondary | ICD-10-CM | POA: Diagnosis not present

## 2024-07-31 DIAGNOSIS — Z7984 Long term (current) use of oral hypoglycemic drugs: Secondary | ICD-10-CM

## 2024-07-31 DIAGNOSIS — E1169 Type 2 diabetes mellitus with other specified complication: Secondary | ICD-10-CM

## 2024-07-31 DIAGNOSIS — Z1329 Encounter for screening for other suspected endocrine disorder: Secondary | ICD-10-CM

## 2024-07-31 DIAGNOSIS — E785 Hyperlipidemia, unspecified: Secondary | ICD-10-CM | POA: Diagnosis not present

## 2024-07-31 DIAGNOSIS — Z0001 Encounter for general adult medical examination with abnormal findings: Secondary | ICD-10-CM

## 2024-07-31 DIAGNOSIS — Z125 Encounter for screening for malignant neoplasm of prostate: Secondary | ICD-10-CM

## 2024-07-31 DIAGNOSIS — Z130 Encounter for screening for diseases of the blood and blood-forming organs and certain disorders involving the immune mechanism: Secondary | ICD-10-CM

## 2024-07-31 DIAGNOSIS — Z13228 Encounter for screening for other metabolic disorders: Secondary | ICD-10-CM

## 2024-07-31 DIAGNOSIS — Z1211 Encounter for screening for malignant neoplasm of colon: Secondary | ICD-10-CM

## 2024-07-31 LAB — HEMOGLOBIN A1C: Hgb A1c MFr Bld: 8.7 % — ABNORMAL HIGH (ref 4.6–6.5)

## 2024-07-31 LAB — LIPID PANEL
Cholesterol: 224 mg/dL — ABNORMAL HIGH (ref 0–200)
HDL: 40.5 mg/dL (ref >39.00)
LDL Cholesterol: 150 mg/dL — ABNORMAL HIGH (ref 0–99)
NonHDL: 183.49
Total CHOL/HDL Ratio: 6
Triglycerides: 169 mg/dL — ABNORMAL HIGH (ref 0.0–149.0)
VLDL: 33.8 mg/dL (ref 0.0–40.0)

## 2024-07-31 LAB — CBC WITH DIFFERENTIAL/PLATELET
Basophils Absolute: 0.1 K/uL (ref 0.0–0.1)
Basophils Relative: 1.4 % (ref 0.0–3.0)
Eosinophils Absolute: 0.1 K/uL (ref 0.0–0.7)
Eosinophils Relative: 2.3 % (ref 0.0–5.0)
HCT: 43.5 % (ref 39.0–52.0)
Hemoglobin: 14.5 g/dL (ref 13.0–17.0)
Lymphocytes Relative: 43.9 % (ref 12.0–46.0)
Lymphs Abs: 1.7 K/uL (ref 0.7–4.0)
MCHC: 33.3 g/dL (ref 30.0–36.0)
MCV: 84.3 fl (ref 78.0–100.0)
Monocytes Absolute: 0.3 K/uL (ref 0.1–1.0)
Monocytes Relative: 8.7 % (ref 3.0–12.0)
Neutro Abs: 1.7 K/uL (ref 1.4–7.7)
Neutrophils Relative %: 43.7 % (ref 43.0–77.0)
Platelets: 185 K/uL (ref 150.0–400.0)
RBC: 5.16 Mil/uL (ref 4.22–5.81)
RDW: 14.1 % (ref 11.5–15.5)
WBC: 4 K/uL (ref 4.0–10.5)

## 2024-07-31 LAB — COMPREHENSIVE METABOLIC PANEL WITH GFR
ALT: 28 U/L (ref 0–53)
AST: 21 U/L (ref 0–37)
Albumin: 4.3 g/dL (ref 3.5–5.2)
Alkaline Phosphatase: 59 U/L (ref 39–117)
BUN: 14 mg/dL (ref 6–23)
CO2: 32 meq/L (ref 19–32)
Calcium: 9.3 mg/dL (ref 8.4–10.5)
Chloride: 102 meq/L (ref 96–112)
Creatinine, Ser: 0.79 mg/dL (ref 0.40–1.50)
GFR: 104.8 mL/min (ref >60.00)
Glucose, Bld: 104 mg/dL — ABNORMAL HIGH (ref 70–99)
Potassium: 3.9 meq/L (ref 3.5–5.1)
Sodium: 140 meq/L (ref 135–145)
Total Bilirubin: 0.7 mg/dL (ref 0.2–1.2)
Total Protein: 6.7 g/dL (ref 6.0–8.3)

## 2024-07-31 MED ORDER — METFORMIN HCL 1000 MG PO TABS: 1000.0000 mg | ORAL_TABLET | Freq: Every day | ORAL | 3 refills | Status: AC

## 2024-07-31 NOTE — Progress Notes (Signed)
 Oscar Fernandez 48 y.o.   Chief Complaint  Patient presents with   Annual Exam    No concerns    HISTORY OF PRESENT ILLNESS: This is a 48 y.o. male here for annual exam and follow-up of chronic medical conditions including diabetes and dyslipidemia Overall doing well.  Has no complaints or medical concerns today.  HPI   Prior to Admission medications   Medication Sig Start Date End Date Taking? Authorizing Provider  aspirin  EC 81 MG tablet Take 1 tablet (81 mg total) by mouth daily. 04/18/14  Yes Loreli Elyn SAILOR, MD  TRULICITY  3 MG/0.5ML SOAJ INJECT 3 MG SUBCUTANEOUSLY ONCE A WEEK . APPOINTMENT REQUIRED FOR FUTURE REFILLS 11/06/23  Yes Soliyana Mcchristian, Emil Schanz, MD  metFORMIN  (GLUCOPHAGE ) 1000 MG tablet TAKE 1 TABLET BY MOUTH TWICE DAILY WITH A MEAL . APPOINTMENT REQUIRED FOR FUTURE REFILLS 05/02/24   Purcell Emil Schanz, MD  rosuvastatin  (CRESTOR ) 20 MG tablet TAKE 1 TABLET BY MOUTH ONCE DAILY. PATINET NEEDS AN OV FOR ADDITIONAL REFILLS. Patient not taking: Reported on 07/31/2024 07/05/22   Purcell Emil Schanz, MD    No Known Allergies  Patient Active Problem List   Diagnosis Date Noted   Dyslipidemia associated with type 2 diabetes mellitus (HCC) 04/19/2014   Hyperlipidemia LDL goal <100 04/19/2014    Past Medical History:  Diagnosis Date   Diabetes mellitus without complication (HCC)    Hyperlipidemia     No past surgical history on file.  Social History   Socioeconomic History   Marital status: Married    Spouse name: Building Surveyor   Number of children: 4   Years of education: college   Highest education level: Not on file  Occupational History   Occupation: industrial  Tobacco Use   Smoking status: Never   Smokeless tobacco: Never  Vaping Use   Vaping status: Never Used  Substance and Sexual Activity   Alcohol use: No   Drug use: No   Sexual activity: Yes  Other Topics Concern   Not on file  Social History Narrative   Not on file   Social Drivers of Health    Financial Resource Strain: Not on file  Food Insecurity: Not on file  Transportation Needs: Not on file  Physical Activity: Not on file  Stress: Not on file  Social Connections: Not on file  Intimate Partner Violence: Not on file    No family history on file.   Review of Systems  Constitutional: Negative.  Negative for chills and fever.  HENT: Negative.  Negative for congestion and sore throat.   Respiratory: Negative.  Negative for cough and shortness of breath.   Cardiovascular: Negative.  Negative for chest pain and palpitations.  Gastrointestinal:  Negative for abdominal pain, diarrhea, nausea and vomiting.  Genitourinary: Negative.  Negative for dysuria and hematuria.  Skin: Negative.  Negative for rash.  Neurological: Negative.  Negative for dizziness and headaches.  All other systems reviewed and are negative.   Vitals:   07/31/24 1550  BP: 124/84  Pulse: 79  Temp: 98.1 F (36.7 C)  SpO2: 97%    Physical Exam Vitals reviewed.  Constitutional:      Appearance: Normal appearance.  HENT:     Head: Normocephalic.     Right Ear: Tympanic membrane, ear canal and external ear normal.     Left Ear: Tympanic membrane, ear canal and external ear normal.     Mouth/Throat:     Mouth: Mucous membranes are moist.     Pharynx:  Oropharynx is clear.  Eyes:     Extraocular Movements: Extraocular movements intact.     Pupils: Pupils are equal, round, and reactive to light.  Cardiovascular:     Rate and Rhythm: Normal rate and regular rhythm.     Pulses: Normal pulses.     Heart sounds: Normal heart sounds.  Pulmonary:     Effort: Pulmonary effort is normal.     Breath sounds: Normal breath sounds.  Abdominal:     Palpations: Abdomen is soft.     Tenderness: There is no abdominal tenderness.  Musculoskeletal:     Cervical back: No tenderness.  Lymphadenopathy:     Cervical: No cervical adenopathy.  Skin:    General: Skin is warm and dry.     Capillary Refill:  Capillary refill takes less than 2 seconds.  Neurological:     Mental Status: He is alert and oriented to person, place, and time.  Psychiatric:        Mood and Affect: Mood normal.        Behavior: Behavior normal.      ASSESSMENT & PLAN: Problem List Items Addressed This Visit       Endocrine   Dyslipidemia associated with type 2 diabetes mellitus (HCC)   Last office visit in 2023 Unknown diabetes status Hemoglobin A1c today Continues metformin  1000 mg daily along with weekly Trulicity  3 mg Cardiovascular risks associated with dyslipidemia and diabetes discussed Diet and nutrition discussed Benefits of exercise discussed Blood work and urine today Follow-up in 3 to 6 months.      Relevant Medications   metFORMIN  (GLUCOPHAGE ) 1000 MG tablet   Other Relevant Orders   Microalbumin / creatinine urine ratio   Hemoglobin A1c   Lipid panel   Vitamin B12   Other Visit Diagnoses       Encounter for general adult medical examination with abnormal findings    -  Primary   Relevant Orders   Comprehensive metabolic panel with GFR   CBC with Differential/Platelet   Hemoglobin A1c   Lipid panel     Screening for deficiency anemia       Relevant Orders   CBC with Differential/Platelet     Screening for endocrine, metabolic and immunity disorder       Relevant Orders   Comprehensive metabolic panel with GFR   Vitamin B12     Screening for prostate cancer       Relevant Orders   PSA      Modifiable risk factors discussed with patient. Anticipatory guidance according to age provided. The following topics were also discussed: Social Determinants of Health Smoking.  Non-smoker Diet and nutrition Benefits of exercise Cancer screening and need for colon cancer screening.  Chooses Cologuard. Vaccinations review and recommendations Cardiovascular risk assessment and need for blood work The 10-year ASCVD risk score (Arnett DK, et al., 2019) is: 9.1%   Values used to  calculate the score:     Age: 48 years     Clincally relevant sex: Male     Is Non-Hispanic African American: Yes     Diabetic: Yes     Tobacco smoker: No     Systolic Blood Pressure: 124 mmHg     Is BP treated: No     HDL Cholesterol: 34 mg/dL     Total Cholesterol: 168 mg/dL  Mental health including depression and anxiety Fall and accident prevention  Patient Instructions  Health Maintenance, Male Adopting a healthy lifestyle and getting preventive care  are important in promoting health and wellness. Ask your health care provider about: The right schedule for you to have regular tests and exams. Things you can do on your own to prevent diseases and keep yourself healthy. What should I know about diet, weight, and exercise? Eat a healthy diet  Eat a diet that includes plenty of vegetables, fruits, low-fat dairy products, and lean protein. Do not eat a lot of foods that are high in solid fats, added sugars, or sodium. Maintain a healthy weight Body mass index (BMI) is a measurement that can be used to identify possible weight problems. It estimates body fat based on height and weight. Your health care provider can help determine your BMI and help you achieve or maintain a healthy weight. Get regular exercise Get regular exercise. This is one of the most important things you can do for your health. Most adults should: Exercise for at least 150 minutes each week. The exercise should increase your heart rate and make you sweat (moderate-intensity exercise). Do strengthening exercises at least twice a week. This is in addition to the moderate-intensity exercise. Spend less time sitting. Even light physical activity can be beneficial. Watch cholesterol and blood lipids Have your blood tested for lipids and cholesterol at 49 years of age, then have this test every 5 years. You may need to have your cholesterol levels checked more often if: Your lipid or cholesterol levels are high. You  are older than 49 years of age. You are at high risk for heart disease. What should I know about cancer screening? Many types of cancers can be detected early and may often be prevented. Depending on your health history and family history, you may need to have cancer screening at various ages. This may include screening for: Colorectal cancer. Prostate cancer. Skin cancer. Lung cancer. What should I know about heart disease, diabetes, and high blood pressure? Blood pressure and heart disease High blood pressure causes heart disease and increases the risk of stroke. This is more likely to develop in people who have high blood pressure readings or are overweight. Talk with your health care provider about your target blood pressure readings. Have your blood pressure checked: Every 3-5 years if you are 79-67 years of age. Every year if you are 76 years old or older. If you are between the ages of 49 and 80 and are a current or former smoker, ask your health care provider if you should have a one-time screening for abdominal aortic aneurysm (AAA). Diabetes Have regular diabetes screenings. This checks your fasting blood sugar level. Have the screening done: Once every three years after age 89 if you are at a normal weight and have a low risk for diabetes. More often and at a younger age if you are overweight or have a high risk for diabetes. What should I know about preventing infection? Hepatitis B If you have a higher risk for hepatitis B, you should be screened for this virus. Talk with your health care provider to find out if you are at risk for hepatitis B infection. Hepatitis C Blood testing is recommended for: Everyone born from [birth year] through 1966. Anyone with known risk factors for hepatitis C. Sexually transmitted infections (STIs) You should be screened each year for STIs, including gonorrhea and chlamydia, if: You are sexually active and are younger than 49 years of age. You are  older than 50 years of age and your health care provider tells you that you are at risk  for this type of infection. Your sexual activity has changed since you were last screened, and you are at increased risk for chlamydia or gonorrhea. Ask your health care provider if you are at risk. Ask your health care provider about whether you are at high risk for HIV. Your health care provider may recommend a prescription medicine to help prevent HIV infection. If you choose to take medicine to prevent HIV, you should first get tested for HIV. You should then be tested every 3 months for as long as you are taking the medicine. Follow these instructions at home: Alcohol use Do not drink alcohol if your health care provider tells you not to drink. If you drink alcohol: Limit how much you have to 0-2 drinks a day. Know how much alcohol is in your drink. In the [place] one drink equals one 12 oz bottle of beer (355 mL), one 5 oz glass of wine (148 mL), or one 1 oz glass of hard liquor (44 mL). Lifestyle Do not use any products that contain nicotine or tobacco. These products include cigarettes, chewing tobacco, and vaping devices, such as e-cigarettes. If you need help quitting, ask your health care provider. Do not use street drugs. Do not share needles. Ask your health care provider for help if you need support or information about quitting drugs. General instructions Schedule regular health, dental, and eye exams. Stay current with your vaccines. Tell your health care provider if: You often feel depressed. You have ever been abused or do not feel safe at home. Summary Adopting a healthy lifestyle and getting preventive care are important in promoting health and wellness. Follow your health care provider's instructions about healthy diet, exercising, and getting tested or screened for diseases. Follow your health care provider's instructions on monitoring your cholesterol and blood pressure. This  information is not intended to replace advice given to you by your health care provider. Make sure you discuss any questions you have with your health care provider. Document Revised: 02/01/2021 Document Reviewed: 02/01/2021 Elsevier Patient Education  2024 [employer]     Emil Schaumann, MD [facility]

## 2024-07-31 NOTE — Patient Instructions (Signed)
 Health Maintenance, Male  Adopting a healthy lifestyle and getting preventive care are important in promoting health and wellness. Ask your health care provider about:  The right schedule for you to have regular tests and exams.  Things you can do on your own to prevent diseases and keep yourself healthy.  What should I know about diet, weight, and exercise?  Eat a healthy diet    Eat a diet that includes plenty of vegetables, fruits, low-fat dairy products, and lean protein.  Do not eat a lot of foods that are high in solid fats, added sugars, or sodium.  Maintain a healthy weight  Body mass index (BMI) is a measurement that can be used to identify possible weight problems. It estimates body fat based on height and weight. Your health care provider can help determine your BMI and help you achieve or maintain a healthy weight.  Get regular exercise  Get regular exercise. This is one of the most important things you can do for your health. Most adults should:  Exercise for at least 150 minutes each week. The exercise should increase your heart rate and make you sweat (moderate-intensity exercise).  Do strengthening exercises at least twice a week. This is in addition to the moderate-intensity exercise.  Spend less time sitting. Even light physical activity can be beneficial.  Watch cholesterol and blood lipids  Have your blood tested for lipids and cholesterol at 49 years of age, then have this test every 5 years.  You may need to have your cholesterol levels checked more often if:  Your lipid or cholesterol levels are high.  You are older than 49 years of age.  You are at high risk for heart disease.  What should I know about cancer screening?  Many types of cancers can be detected early and may often be prevented. Depending on your health history and family history, you may need to have cancer screening at various ages. This may include screening for:  Colorectal cancer.  Prostate cancer.  Skin cancer.  Lung  cancer.  What should I know about heart disease, diabetes, and high blood pressure?  Blood pressure and heart disease  High blood pressure causes heart disease and increases the risk of stroke. This is more likely to develop in people who have high blood pressure readings or are overweight.  Talk with your health care provider about your target blood pressure readings.  Have your blood pressure checked:  Every 3-5 years if you are 24-52 years of age.  Every year if you are 3 years old or older.  If you are between the ages of 60 and 72 and are a current or former smoker, ask your health care provider if you should have a one-time screening for abdominal aortic aneurysm (AAA).  Diabetes  Have regular diabetes screenings. This checks your fasting blood sugar level. Have the screening done:  Once every three years after age 66 if you are at a normal weight and have a low risk for diabetes.  More often and at a younger age if you are overweight or have a high risk for diabetes.  What should I know about preventing infection?  Hepatitis B  If you have a higher risk for hepatitis B, you should be screened for this virus. Talk with your health care provider to find out if you are at risk for hepatitis B infection.  Hepatitis C  Blood testing is recommended for:  Everyone born from [birth year] through 1966.  Anyone  with known risk factors for hepatitis C.  Sexually transmitted infections (STIs)  You should be screened each year for STIs, including gonorrhea and chlamydia, if:  You are sexually active and are younger than 49 years of age.  You are older than 49 years of age and your health care provider tells you that you are at risk for this type of infection.  Your sexual activity has changed since you were last screened, and you are at increased risk for chlamydia or gonorrhea. Ask your health care provider if you are at risk.  Ask your health care provider about whether you are at high risk for HIV. Your health care provider  may recommend a prescription medicine to help prevent HIV infection. If you choose to take medicine to prevent HIV, you should first get tested for HIV. You should then be tested every 3 months for as long as you are taking the medicine.  Follow these instructions at home:  Alcohol use  Do not drink alcohol if your health care provider tells you not to drink.  If you drink alcohol:  Limit how much you have to 0-2 drinks a day.  Know how much alcohol is in your drink. In the [place] one drink equals one 12 oz bottle of beer (355 mL), one 5 oz glass of wine (148 mL), or one 1 oz glass of hard liquor (44 mL).  Lifestyle  Do not use any products that contain nicotine or tobacco. These products include cigarettes, chewing tobacco, and vaping devices, such as e-cigarettes. If you need help quitting, ask your health care provider.  Do not use street drugs.  Do not share needles.  Ask your health care provider for help if you need support or information about quitting drugs.  General instructions  Schedule regular health, dental, and eye exams.  Stay current with your vaccines.  Tell your health care provider if:  You often feel depressed.  You have ever been abused or do not feel safe at home.  Summary  Adopting a healthy lifestyle and getting preventive care are important in promoting health and wellness.  Follow your health care provider's instructions about healthy diet, exercising, and getting tested or screened for diseases.  Follow your health care provider's instructions on monitoring your cholesterol and blood pressure.  This information is not intended to replace advice given to you by your health care provider. Make sure you discuss any questions you have with your health care provider.  Document Revised: 02/01/2021 Document Reviewed: 02/01/2021  Elsevier Patient Education  2024 [employer].

## 2024-07-31 NOTE — Assessment & Plan Note (Signed)
 Last office visit in 2023 Unknown diabetes status Hemoglobin A1c today Continues metformin  1000 mg daily along with weekly Trulicity  3 mg Cardiovascular risks associated with dyslipidemia and diabetes discussed Diet and nutrition discussed Benefits of exercise discussed Blood work and urine today Follow-up in 3 to 6 months.

## 2024-08-01 ENCOUNTER — Ambulatory Visit: Payer: Self-pay | Admitting: Emergency Medicine

## 2024-08-01 LAB — MICROALBUMIN / CREATININE URINE RATIO
Creatinine,U: 133.3 mg/dL
Microalb Creat Ratio: 5.3 mg/g (ref 0.0–30.0)
Microalb, Ur: 0.7 mg/dL (ref 0.0–1.9)

## 2024-08-01 LAB — VITAMIN B12: Vitamin B-12: 484 pg/mL (ref 211–911)

## 2024-08-01 LAB — PSA: PSA: 0.6 ng/mL (ref 0.10–4.00)

## 2024-08-05 LAB — OPHTHALMOLOGY REPORT-SCANNED

## 2024-08-06 ENCOUNTER — Other Ambulatory Visit: Payer: Self-pay | Admitting: Emergency Medicine

## 2024-08-06 DIAGNOSIS — E1169 Type 2 diabetes mellitus with other specified complication: Secondary | ICD-10-CM

## 2024-10-04 LAB — COLOGUARD: COLOGUARD: NEGATIVE
